# Patient Record
Sex: Female | Born: 1965 | Race: White | Hispanic: No | Smoking: Current every day smoker
Health system: Southern US, Community
[De-identification: ages and names within clinical notes are randomized; demographics above are authoritative.]

## PROBLEM LIST (undated history)

## (undated) DIAGNOSIS — Z720 Tobacco use: Secondary | ICD-10-CM

## (undated) DIAGNOSIS — I509 Heart failure, unspecified: Secondary | ICD-10-CM

## (undated) DIAGNOSIS — I2542 Coronary artery dissection: Secondary | ICD-10-CM

## (undated) DIAGNOSIS — E039 Hypothyroidism, unspecified: Secondary | ICD-10-CM

## (undated) DIAGNOSIS — Z8249 Family history of ischemic heart disease and other diseases of the circulatory system: Secondary | ICD-10-CM

## (undated) DIAGNOSIS — E785 Hyperlipidemia, unspecified: Secondary | ICD-10-CM

## (undated) DIAGNOSIS — I5181 Takotsubo syndrome: Secondary | ICD-10-CM

## (undated) DIAGNOSIS — I2510 Atherosclerotic heart disease of native coronary artery without angina pectoris: Secondary | ICD-10-CM

## (undated) DIAGNOSIS — I5020 Unspecified systolic (congestive) heart failure: Secondary | ICD-10-CM

## (undated) DIAGNOSIS — I5032 Chronic diastolic (congestive) heart failure: Secondary | ICD-10-CM

## (undated) DIAGNOSIS — E079 Disorder of thyroid, unspecified: Secondary | ICD-10-CM

## (undated) DIAGNOSIS — I2119 ST elevation (STEMI) myocardial infarction involving other coronary artery of inferior wall: Secondary | ICD-10-CM

## (undated) HISTORY — DX: Heart failure, unspecified: I50.9

## (undated) HISTORY — DX: Atherosclerotic heart disease of native coronary artery without angina pectoris: I25.10

## (undated) HISTORY — DX: Hyperlipidemia, unspecified: E78.5

## (undated) HISTORY — DX: Chronic diastolic (congestive) heart failure: I50.32

## (undated) HISTORY — PX: CARDIAC CATHETERIZATION: SHX172

## (undated) HISTORY — DX: Coronary artery dissection: I25.42

## (undated) HISTORY — DX: Takotsubo syndrome: I51.81

## (undated) HISTORY — PX: ECTOPIC PREGNANCY SURGERY: SHX613

## (undated) HISTORY — DX: Unspecified systolic (congestive) heart failure: I50.20

## (undated) HISTORY — DX: ST elevation (STEMI) myocardial infarction involving other coronary artery of inferior wall: I21.19

## (undated) HISTORY — DX: Tobacco use: Z72.0

## (undated) HISTORY — DX: Hypothyroidism, unspecified: E03.9

---

## 2020-04-03 LAB — COLOGUARD

## 2020-09-22 ENCOUNTER — Emergency Department: Payer: Medicaid Other

## 2020-09-22 ENCOUNTER — Other Ambulatory Visit: Payer: Self-pay

## 2020-09-22 ENCOUNTER — Emergency Department
Admission: EM | Admit: 2020-09-22 | Discharge: 2020-09-22 | Disposition: A | Discharge status: Home / Self Care | Payer: Medicaid Other | Attending: Emergency Medicine | Admitting: Emergency Medicine

## 2020-09-22 ENCOUNTER — Encounter: Payer: Self-pay | Admitting: Emergency Medicine

## 2020-09-22 DIAGNOSIS — T23202A Burn of second degree of left hand, unspecified site, initial encounter: Secondary | ICD-10-CM | POA: Insufficient documentation

## 2020-09-22 DIAGNOSIS — S20212A Contusion of left front wall of thorax, initial encounter: Secondary | ICD-10-CM | POA: Insufficient documentation

## 2020-09-22 DIAGNOSIS — T23262A Burn of second degree of back of left hand, initial encounter: Secondary | ICD-10-CM

## 2020-09-22 DIAGNOSIS — X12XXXA Contact with other hot fluids, initial encounter: Secondary | ICD-10-CM | POA: Insufficient documentation

## 2020-09-22 DIAGNOSIS — Y929 Unspecified place or not applicable: Secondary | ICD-10-CM | POA: Insufficient documentation

## 2020-09-22 DIAGNOSIS — F17200 Nicotine dependence, unspecified, uncomplicated: Secondary | ICD-10-CM | POA: Insufficient documentation

## 2020-09-22 DIAGNOSIS — W108XXA Fall (on) (from) other stairs and steps, initial encounter: Secondary | ICD-10-CM | POA: Insufficient documentation

## 2020-09-22 DIAGNOSIS — S62337A Displaced fracture of neck of fifth metacarpal bone, left hand, initial encounter for closed fracture: Secondary | ICD-10-CM | POA: Insufficient documentation

## 2020-09-22 HISTORY — DX: Disorder of thyroid, unspecified: E07.9

## 2020-09-22 MED ORDER — CEPHALEXIN 500 MG PO CAPS: 500.0000 mg | ORAL_CAPSULE | Freq: Four times a day (QID) | ORAL | 0 refills | Status: AC

## 2020-09-22 MED ORDER — ACETAMINOPHEN 325 MG PO TABS
650.0000 mg | ORAL_TABLET | Freq: Once | ORAL | Status: AC
  Administered 2020-09-22: 650 mg via ORAL
  Filled 2020-09-22: qty 2

## 2020-09-22 MED ORDER — SULFAMETHOXAZOLE-TRIMETHOPRIM 800-160 MG PO TABS: 1.0000 | ORAL_TABLET | Freq: Two times a day (BID) | ORAL | 0 refills | Status: AC

## 2020-09-22 MED ORDER — OXYCODONE-ACETAMINOPHEN 5-325 MG PO TABS
1.0000 | ORAL_TABLET | Freq: Once | ORAL | Status: AC
  Administered 2020-09-22: 1 via ORAL
  Filled 2020-09-22: qty 1

## 2020-09-22 MED ORDER — SULFAMETHOXAZOLE-TRIMETHOPRIM 800-160 MG PO TABS
1.0000 | ORAL_TABLET | Freq: Once | ORAL | Status: AC
  Administered 2020-09-22: 1 via ORAL
  Filled 2020-09-22: qty 1

## 2020-09-22 MED ORDER — CEPHALEXIN 500 MG PO CAPS
1000.0000 mg | ORAL_CAPSULE | Freq: Once | ORAL | Status: AC
  Administered 2020-09-22: 1000 mg via ORAL
  Filled 2020-09-22: qty 2

## 2020-09-22 MED ORDER — OXYCODONE-ACETAMINOPHEN 5-325 MG PO TABS: 1.0000 | ORAL_TABLET | Freq: Four times a day (QID) | ORAL | 0 refills | Status: AC | PRN
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
## 2020-10-04 ENCOUNTER — Ambulatory Visit: Payer: Medicaid Other | Admitting: Physician Assistant

## 2020-11-02 ENCOUNTER — Encounter: Payer: Medicaid Other | Admitting: Obstetrics and Gynecology

## 2020-12-11 ENCOUNTER — Other Ambulatory Visit: Payer: Self-pay

## 2020-12-11 ENCOUNTER — Encounter
Admission: EM | Disposition: A | Discharge status: Home / Self Care | Payer: Self-pay | Source: Home / Self Care | Attending: Internal Medicine

## 2020-12-11 ENCOUNTER — Inpatient Hospital Stay
Admission: EM | Admit: 2020-12-11 | Discharge: 2020-12-13 | DRG: 280 | Disposition: A | Discharge status: Home / Self Care | Payer: Self-pay | Attending: Internal Medicine | Admitting: Internal Medicine

## 2020-12-11 ENCOUNTER — Emergency Department: Payer: Self-pay

## 2020-12-11 DIAGNOSIS — E785 Hyperlipidemia, unspecified: Secondary | ICD-10-CM

## 2020-12-11 DIAGNOSIS — J9601 Acute respiratory failure with hypoxia: Secondary | ICD-10-CM | POA: Diagnosis present

## 2020-12-11 DIAGNOSIS — I213 ST elevation (STEMI) myocardial infarction of unspecified site: Secondary | ICD-10-CM

## 2020-12-11 DIAGNOSIS — I2542 Coronary artery dissection: Principal | ICD-10-CM

## 2020-12-11 DIAGNOSIS — E039 Hypothyroidism, unspecified: Secondary | ICD-10-CM | POA: Diagnosis present

## 2020-12-11 DIAGNOSIS — I509 Heart failure, unspecified: Secondary | ICD-10-CM

## 2020-12-11 DIAGNOSIS — I5021 Acute systolic (congestive) heart failure: Secondary | ICD-10-CM | POA: Diagnosis present

## 2020-12-11 DIAGNOSIS — Z20822 Contact with and (suspected) exposure to covid-19: Secondary | ICD-10-CM | POA: Diagnosis present

## 2020-12-11 DIAGNOSIS — Z720 Tobacco use: Secondary | ICD-10-CM

## 2020-12-11 DIAGNOSIS — I2510 Atherosclerotic heart disease of native coronary artery without angina pectoris: Secondary | ICD-10-CM | POA: Diagnosis present

## 2020-12-11 DIAGNOSIS — I2121 ST elevation (STEMI) myocardial infarction involving left circumflex coronary artery: Secondary | ICD-10-CM | POA: Diagnosis present

## 2020-12-11 DIAGNOSIS — I2119 ST elevation (STEMI) myocardial infarction involving other coronary artery of inferior wall: Secondary | ICD-10-CM | POA: Diagnosis present

## 2020-12-11 DIAGNOSIS — Z79890 Hormone replacement therapy (postmenopausal): Secondary | ICD-10-CM

## 2020-12-11 DIAGNOSIS — F17200 Nicotine dependence, unspecified, uncomplicated: Secondary | ICD-10-CM

## 2020-12-11 DIAGNOSIS — I110 Hypertensive heart disease with heart failure: Secondary | ICD-10-CM | POA: Diagnosis present

## 2020-12-11 DIAGNOSIS — Z888 Allergy status to other drugs, medicaments and biological substances status: Secondary | ICD-10-CM

## 2020-12-11 DIAGNOSIS — F17210 Nicotine dependence, cigarettes, uncomplicated: Secondary | ICD-10-CM | POA: Diagnosis present

## 2020-12-11 DIAGNOSIS — I420 Dilated cardiomyopathy: Secondary | ICD-10-CM

## 2020-12-11 HISTORY — PX: LEFT HEART CATH AND CORONARY ANGIOGRAPHY: CATH118249

## 2020-12-11 HISTORY — DX: ST elevation (STEMI) myocardial infarction of unspecified site: I21.3

## 2020-12-11 HISTORY — DX: Family history of ischemic heart disease and other diseases of the circulatory system: Z82.49

## 2020-12-11 LAB — CBC
HCT: 37.2 % (ref 36.0–46.0)
Hemoglobin: 12.6 g/dL (ref 12.0–15.0)
MCH: 31.1 pg (ref 26.0–34.0)
MCHC: 33.9 g/dL (ref 30.0–36.0)
MCV: 91.9 fL (ref 80.0–100.0)
Platelets: 321 10*3/uL (ref 150–400)
RBC: 4.05 MIL/uL (ref 3.87–5.11)
RDW: 14.2 % (ref 11.5–15.5)
WBC: 7.6 10*3/uL (ref 4.0–10.5)
nRBC: 0 % (ref 0.0–0.2)

## 2020-12-11 LAB — BASIC METABOLIC PANEL
Anion gap: 10 (ref 5–15)
BUN: 22 mg/dL — ABNORMAL HIGH (ref 6–20)
CO2: 24 mmol/L (ref 22–32)
Calcium: 9.1 mg/dL (ref 8.9–10.3)
Chloride: 104 mmol/L (ref 98–111)
Creatinine, Ser: 0.83 mg/dL (ref 0.44–1.00)
GFR, Estimated: >60 mL/min (ref >60)
Glucose, Bld: 114 mg/dL — ABNORMAL HIGH (ref 70–99)
Potassium: 3.7 mmol/L (ref 3.5–5.1)
Sodium: 138 mmol/L (ref 135–145)

## 2020-12-11 LAB — TROPONIN I (HIGH SENSITIVITY): Troponin I (High Sensitivity): 55 ng/L — ABNORMAL HIGH (ref <18)

## 2020-12-11 SURGERY — LEFT HEART CATH AND CORONARY ANGIOGRAPHY
Anesthesia: Moderate Sedation

## 2020-12-11 MED ORDER — FENTANYL CITRATE PF 50 MCG/ML IJ SOSY
PREFILLED_SYRINGE | INTRAMUSCULAR | Status: AC
  Filled 2020-12-11: qty 1

## 2020-12-11 MED ORDER — MIDAZOLAM HCL 2 MG/2ML IJ SOLN
INTRAMUSCULAR | Status: DC | PRN
  Administered 2020-12-11: 1 mg via INTRAVENOUS

## 2020-12-11 MED ORDER — HEPARIN SODIUM (PORCINE) 5000 UNIT/ML IJ SOLN
4000.0000 [IU] | Freq: Once | INTRAMUSCULAR | Status: AC
  Administered 2020-12-11: 4000 [IU] via INTRAVENOUS

## 2020-12-11 MED ORDER — MIDAZOLAM HCL 2 MG/2ML IJ SOLN
INTRAMUSCULAR | Status: AC
  Filled 2020-12-11: qty 2

## 2020-12-11 MED ORDER — FUROSEMIDE 10 MG/ML IJ SOLN
20.0000 mg | Freq: Once | INTRAMUSCULAR | Status: AC
  Administered 2020-12-11: 20 mg via INTRAVENOUS
  Filled 2020-12-11: qty 4

## 2020-12-11 MED ORDER — LIDOCAINE HCL 1 % IJ SOLN
INTRAMUSCULAR | Status: AC
  Filled 2020-12-11: qty 20

## 2020-12-11 MED ORDER — HEPARIN (PORCINE) IN NACL 1000-0.9 UT/500ML-% IV SOLN
INTRAVENOUS | Status: DC | PRN
  Administered 2020-12-11: 1000 mL

## 2020-12-11 MED ORDER — HEPARIN (PORCINE) IN NACL 1000-0.9 UT/500ML-% IV SOLN
INTRAVENOUS | Status: AC
  Filled 2020-12-11: qty 1000

## 2020-12-11 MED ORDER — HEPARIN SODIUM (PORCINE) 1000 UNIT/ML IJ SOLN
INTRAMUSCULAR | Status: AC
  Filled 2020-12-11: qty 1

## 2020-12-11 MED ORDER — VERAPAMIL HCL 2.5 MG/ML IV SOLN
INTRAVENOUS | Status: AC
  Filled 2020-12-11: qty 2

## 2020-12-11 MED ORDER — MORPHINE SULFATE (PF) 4 MG/ML IV SOLN: 4.0000 mg | Freq: Once | INTRAVENOUS | Status: DC

## 2020-12-11 MED ORDER — IOHEXOL 350 MG/ML SOLN
INTRAVENOUS | Status: DC | PRN
  Administered 2020-12-11: 50 mL

## 2020-12-11 MED ORDER — LIDOCAINE HCL (PF) 1 % IJ SOLN
INTRAMUSCULAR | Status: DC | PRN
  Administered 2020-12-11: 2 mL

## 2020-12-11 MED ORDER — FENTANYL CITRATE (PF) 100 MCG/2ML IJ SOLN
INTRAMUSCULAR | Status: DC | PRN
  Administered 2020-12-11: 50 ug via INTRAVENOUS

## 2020-12-11 MED ORDER — HEPARIN SODIUM (PORCINE) 1000 UNIT/ML IJ SOLN
INTRAMUSCULAR | Status: DC | PRN
  Administered 2020-12-11: 3500 [IU] via INTRAVENOUS

## 2020-12-11 MED ORDER — ASPIRIN 81 MG PO CHEW
324.0000 mg | CHEWABLE_TABLET | Freq: Once | ORAL | Status: AC
  Administered 2020-12-11: 324 mg via ORAL

## 2020-12-11 MED ORDER — VERAPAMIL HCL 2.5 MG/ML IV SOLN
INTRAVENOUS | Status: DC | PRN
  Administered 2020-12-11: 2.5 mg via INTRA_ARTERIAL

## 2020-12-11 SURGICAL SUPPLY — 14 items
CATH INFINITI 5 FR JL3.5 (CATHETERS) ×2 IMPLANT
CATH INFINITI 5FR ANG PIGTAIL (CATHETERS) ×2 IMPLANT
CATH LAUNCHER 6FR JR4 (CATHETERS) ×2 IMPLANT
DEVICE RAD TR BAND REGULAR (VASCULAR PRODUCTS) ×2 IMPLANT
DRAPE BRACHIAL (DRAPES) ×2 IMPLANT
GLIDESHEATH SLEND SS 6F .021 (SHEATH) ×2 IMPLANT
GUIDEWIRE INQWIRE 1.5J.035X260 (WIRE) ×1 IMPLANT
INQWIRE 1.5J .035X260CM (WIRE) ×2
KIT ENCORE 26 ADVANTAGE (KITS) ×2 IMPLANT
PACK CARDIAC CATH (CUSTOM PROCEDURE TRAY) ×2 IMPLANT
PROTECTION STATION PRESSURIZED (MISCELLANEOUS) ×2
SET ATX SIMPLICITY (MISCELLANEOUS) ×2 IMPLANT
STATION PROTECTION PRESSURIZED (MISCELLANEOUS) ×1 IMPLANT
TUBING CIL FLEX 10 FLL-RA (TUBING) ×2 IMPLANT

## 2020-12-11 NOTE — ED Triage Notes (Addendum)
Patient arrived via EMS with c/c of chest pain with radiation to to right arm, nausea, shortness of breath, and diaphoresis. On EKG performed by EMS shows STEMI. Pt was administered 324mg  of ASA and 1 NTG PTA. A&O on arrival

## 2020-12-11 NOTE — ED Notes (Signed)
Cardiologist and radiology at the bedside to perform chest x-ray

## 2020-12-11 NOTE — Progress Notes (Signed)
Prayer for patient as staff worked with patient

## 2020-12-11 NOTE — Interval H&P Note (Signed)
History and Physical Interval Note:  12/11/2020 11:10 PM  Gabrielle Riley  has presented today for surgery, with the diagnosis of STEMI.  The various methods of treatment have been discussed with the patient and family. After consideration of risks, benefits and other options for treatment, the patient has consented to  Procedure(s): Coronary/Graft Acute MI Revascularization (N/A) LEFT HEART CATH AND CORONARY ANGIOGRAPHY (N/A) as a surgical intervention.  The patient's history has been reviewed, patient examined, no change in status, stable for surgery.  I have reviewed the patient's chart and labs.  Questions were answered to the patient's satisfaction.    Cath Lab Visit (complete for each Cath Lab visit)  Clinical Evaluation Leading to the Procedure:   ACS: Yes.    Non-ACS:  N/A  Chantry Headen
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
## 2020-12-11 NOTE — ED Notes (Signed)
ED doctor at the bedside.

## 2020-12-11 NOTE — H&P (View-Only) (Signed)
Cardiology Consultation:   Patient ID: Gabrielle Riley MRN: [id number]; DOB: 02/28/1966  Admit date: 12/11/2020 Date of Consult: 12/11/2020  PCP:  [facility]   [facility] Providers Cardiologist: New - Jamere Stidham   Patient Profile:   Gabrielle Riley is a 55 y.o. female with a hx of thyroid disease and tobacco abuse, who is being seen 12/11/2020 for the evaluation of chest pain and abnormal EKG at the request of Dr. Sidney Ace.  History of Present Illness:   Gabrielle Riley reports that approximately an hour ago, she was feeling generally unwell at home.  She got up to go to bed and had sudden onset of a severe burning and pressure-like sensation in the center of her chest.  It radiates to the left armpit and is associated with shortness of breath and transient nausea.  She is not felt anything like this before.  She reports having injured her left hand several months ago and began to feel some aching in the left hand earlier today for which she took one of her remaining Percocet.  Aforementioned chest pain began about an hour later.  She currently complains of continued severe chest pain as well as shortness of breath.  She was noted to be hypoxic in the emergency department and is currently satting in the low to mid 90s on 3 L of nasal cannula.   Past Medical History:  Diagnosis Date   Thyroid disease     Past Surgical History:  Procedure Laterality Date   ECTOPIC PREGNANCY SURGERY       Home Medications:  Prior to Admission medications   Medication Sig Start Date Dnaiel Riley Date Taking? Authorizing Provider  levothyroxine (SYNTHROID) 100 MCG tablet Take 100 mcg by mouth daily before breakfast.   Yes [provider]  oxyCODONE-acetaminophen (PERCOCET) 10-325 MG tablet Take 1 tablet by mouth every 4 (four) hours as needed for pain.   Yes [provider]    Inpatient Medications: Scheduled Meds:  aspirin  324 mg Oral Once   furosemide  20 mg Intravenous Once    heparin  4,000 Units Intravenous Once   Continuous Infusions:  PRN Meds:   Allergies:    Allergies  Allergen Reactions   Imitrex [Sumatriptan]     Throat closing    Social History:   Social History   Tobacco Use   Smoking status: Every Day    Packs/day: 0.50    Years: 25.00    Pack years: 12.50    Types: Cigarettes  Substance Use Topics   Alcohol use: Not Currently   Drug use: Not Currently     Family History:   Brother recently had stent placed in a coronary artery (late 24s).  ROS:  Unable to assess due to acute illness.  Physical Exam/Data:   Vitals:   12/11/20 2235 12/11/20 2242 12/11/20 2245  BP:  (!) 162/102   Pulse:  81   Resp:  (!) 22   Temp:  (!) 97.5 F (36.4 C)   TempSrc:  Oral   SpO2: 100% 92%   Weight:   68.9 kg  Height:   5\' 1"  (1.549 m)   No intake or output data in the 24 hours ending 12/11/20 2256 Last 3 Weights 12/11/2020  Weight (lbs) 151 lb 14.4 oz  Weight (kg) 68.901 kg     Body mass index is 28.7 kg/m.  General: Uncomfortable appearing woman, lying on stretcher in the emergency department. HEENT: normal Lymph: no adenopathy Neck: Unable to assess JVP  due to accessory muscle use Endocrine:  No thryomegaly Vascular: No carotid bruits; 2+ radial pulses bilaterally. Cardiac:  normal S1, S2; RRR; no murmurs, rubs, or gallops. Lungs: Coarse breath sounds bilaterally with bibasilar crackles. Abd: soft, nontender, no hepatomegaly  Ext: no edema Musculoskeletal:  No deformities, BUE and BLE strength normal and equal Skin: warm and dry  Neuro:  CNs 2-12 intact, no focal abnormalities noted Psych:  Normal affect   EKG:  The EKG was personally reviewed and demonstrates: Normal sinus rhythm with 1 to 2 mm inferolateral ST elevation. Telemetry:  Telemetry was personally reviewed and demonstrates: Sinus rhythm.  Relevant CV Studies: None.  Laboratory Data:  High Sensitivity Troponin:  No results for input(s): TROPONINIHS in the last  720 hours.   ChemistryNo results for input(s): NA, K, CL, CO2, GLUCOSE, BUN, CREATININE, CALCIUM, GFRNONAA, GFRAA, ANIONGAP in the last 168 hours.  No results for input(s): PROT, ALBUMIN, AST, ALT, ALKPHOS, BILITOT in the last 168 hours. Hematology Recent Labs  Lab 12/11/20 2234  WBC 7.6  RBC 4.05  HGB 12.6  HCT 37.2  MCV 91.9  MCH 31.1  MCHC 33.9  RDW 14.2  PLT 321   BNPNo results for input(s): BNP, PROBNP in the last 168 hours.  DDimer No results for input(s): DDIMER in the last 168 hours.   Radiology/Studies:  No results found.   Assessment and Plan:   Inferolateral STEMI: Patient presents with acute onset of chest pain this evening, found to have inferolateral ST elevation concerning for STEMI.  She continues to have severe pain as well as shortness of breath accompanied by hypoxia.  We have discussed further management options and agreed to proceed with emergent cardiac catheterization and possible PCI.  She has provided emergent verbal consent.  She has received aspirin and heparin.  Further recommendations to be made following catheterization.  Acute respiratory failure with hypoxia and acute heart failure: I am concerned that acute dyspnea and hypoxia are due to heart failure in the setting of acute MI.  She does not appear significant volume load on exam though crackles are appreciated prolonged.  Preliminary review of her chest radiograph also shows evidence of interstitial edema.  I have asked the ED team to administer furosemide 20 mg IV x1.  Echocardiogram to be performed after catheterization.  We will also assess her LVEDP during the procedure.  Tobacco abuse: Smoking cessation recommended.  Thyroid disease: Continue home dose of Synthroid.   Risk Assessment/Risk Scores:   TIMI Risk Score for ST  Elevation MI:   The patient's TIMI risk score is 2, which indicates a 2.2% risk of all cause mortality at 30 days  New York Heart Association (NYHA) Functional  Class NYHA Class IV  For questions or updates, please contact [facility] Please consult [email or web address] for contact info under [facility].  Signed, Yvonne Kendall, MD  12/11/2020 10:56 PM

## 2020-12-11 NOTE — ED Notes (Signed)
Patient transported to the cath lab via stretcher on zoll monitor by RN x 2 and cardiology. Guarded at transfer

## 2020-12-12 ENCOUNTER — Inpatient Hospital Stay (HOSPITAL_COMMUNITY)
Admit: 2020-12-12 | Discharge: 2020-12-12 | Disposition: A | Discharge status: Home / Self Care | Payer: Self-pay | Attending: Internal Medicine | Admitting: Internal Medicine

## 2020-12-12 ENCOUNTER — Encounter: Payer: Self-pay | Admitting: Internal Medicine

## 2020-12-12 DIAGNOSIS — I509 Heart failure, unspecified: Secondary | ICD-10-CM | POA: Insufficient documentation

## 2020-12-12 DIAGNOSIS — I2121 ST elevation (STEMI) myocardial infarction involving left circumflex coronary artery: Secondary | ICD-10-CM

## 2020-12-12 LAB — CBC
HCT: 40.1 % (ref 36.0–46.0)
Hemoglobin: 13.3 g/dL (ref 12.0–15.0)
MCH: 29.6 pg (ref 26.0–34.0)
MCHC: 33.2 g/dL (ref 30.0–36.0)
MCV: 89.1 fL (ref 80.0–100.0)
Platelets: 329 10*3/uL (ref 150–400)
RBC: 4.5 MIL/uL (ref 3.87–5.11)
RDW: 14.3 % (ref 11.5–15.5)
WBC: 9.8 10*3/uL (ref 4.0–10.5)
nRBC: 0 % (ref 0.0–0.2)

## 2020-12-12 LAB — URINE DRUG SCREEN, QUALITATIVE (ARMC ONLY)
Amphetamines, Ur Screen: NOT DETECTED
Barbiturates, Ur Screen: NOT DETECTED
Benzodiazepine, Ur Scrn: POSITIVE — AB
Cannabinoid 50 Ng, Ur ~~LOC~~: NOT DETECTED
Cocaine Metabolite,Ur ~~LOC~~: NOT DETECTED
MDMA (Ecstasy)Ur Screen: NOT DETECTED
Methadone Scn, Ur: NOT DETECTED
Opiate, Ur Screen: NOT DETECTED
Phencyclidine (PCP) Ur S: NOT DETECTED
Tricyclic, Ur Screen: NOT DETECTED

## 2020-12-12 LAB — ECHOCARDIOGRAM COMPLETE
AR max vel: 1.91 cm2
AV Area VTI: 2.06 cm2
AV Area mean vel: 2.04 cm2
AV Mean grad: 3 mmHg
AV Peak grad: 5.4 mmHg
Ao pk vel: 1.16 m/s
Area-P 1/2: 5.2 cm2
Height: 61 in
MV VTI: 2.51 cm2
S' Lateral: 2.74 cm
Weight: 2430.4 oz

## 2020-12-12 LAB — TROPONIN I (HIGH SENSITIVITY)
Troponin I (High Sensitivity): 6142 ng/L (ref <18)
Troponin I (High Sensitivity): 18237 ng/L (ref <18)
Troponin I (High Sensitivity): 12988 ng/L (ref <18)

## 2020-12-12 LAB — MRSA NEXT GEN BY PCR, NASAL: MRSA by PCR Next Gen: NOT DETECTED

## 2020-12-12 LAB — BASIC METABOLIC PANEL
Anion gap: 10 (ref 5–15)
BUN: 20 mg/dL (ref 6–20)
CO2: 25 mmol/L (ref 22–32)
Calcium: 8.9 mg/dL (ref 8.9–10.3)
Chloride: 103 mmol/L (ref 98–111)
Creatinine, Ser: 0.69 mg/dL (ref 0.44–1.00)
GFR, Estimated: >60 mL/min (ref >60)
Glucose, Bld: 134 mg/dL — ABNORMAL HIGH (ref 70–99)
Potassium: 3.6 mmol/L (ref 3.5–5.1)
Sodium: 138 mmol/L (ref 135–145)

## 2020-12-12 LAB — RESP PANEL BY RT-PCR (FLU A&B, COVID) ARPGX2
Influenza A by PCR: NEGATIVE
Influenza B by PCR: NEGATIVE
SARS Coronavirus 2 by RT PCR: NEGATIVE

## 2020-12-12 LAB — GLUCOSE, CAPILLARY: Glucose-Capillary: 96 mg/dL (ref 70–99)

## 2020-12-12 MED ORDER — CARVEDILOL 3.125 MG PO TABS
3.1250 mg | ORAL_TABLET | Freq: Two times a day (BID) | ORAL | Status: DC
  Administered 2020-12-12 – 2020-12-13 (×4): 3.125 mg via ORAL
  Filled 2020-12-12 (×5): qty 1

## 2020-12-12 MED ORDER — ASPIRIN-ACETAMINOPHEN-CAFFEINE 250-250-65 MG PO TABS
1.0000 | ORAL_TABLET | Freq: Once | ORAL | Status: AC
  Administered 2020-12-12: 1 via ORAL
  Filled 2020-12-12: qty 1

## 2020-12-12 MED ORDER — LABETALOL HCL 5 MG/ML IV SOLN: 10.0000 mg | INTRAVENOUS | Status: AC | PRN

## 2020-12-12 MED ORDER — POTASSIUM CHLORIDE CRYS ER 20 MEQ PO TBCR
40.0000 meq | EXTENDED_RELEASE_TABLET | Freq: Once | ORAL | Status: AC
  Administered 2020-12-12: 40 meq via ORAL
  Filled 2020-12-12: qty 2

## 2020-12-12 MED ORDER — PERFLUTREN LIPID MICROSPHERE
1.0000 mL | INTRAVENOUS | Status: AC | PRN
  Administered 2020-12-12: 3 mL via INTRAVENOUS
  Filled 2020-12-12: qty 10

## 2020-12-12 MED ORDER — LOSARTAN POTASSIUM 25 MG PO TABS
12.5000 mg | ORAL_TABLET | Freq: Every day | ORAL | Status: DC
  Administered 2020-12-12 – 2020-12-13 (×2): 12.5 mg via ORAL
  Filled 2020-12-12 (×2): qty 0.5

## 2020-12-12 MED ORDER — SODIUM CHLORIDE 0.9% FLUSH
3.0000 mL | Freq: Two times a day (BID) | INTRAVENOUS | Status: DC
  Administered 2020-12-12 – 2020-12-13 (×3): 3 mL via INTRAVENOUS

## 2020-12-12 MED ORDER — SODIUM CHLORIDE 0.9 % IV SOLN: 250.0000 mL | INTRAVENOUS | Status: DC | PRN

## 2020-12-12 MED ORDER — MORPHINE SULFATE (PF) 2 MG/ML IV SOLN
2.0000 mg | INTRAVENOUS | Status: AC | PRN
  Administered 2020-12-12 (×3): 2 mg via INTRAVENOUS
  Filled 2020-12-12 (×3): qty 1

## 2020-12-12 MED ORDER — ATORVASTATIN CALCIUM 20 MG PO TABS
20.0000 mg | ORAL_TABLET | Freq: Every day | ORAL | Status: DC
  Administered 2020-12-12 – 2020-12-13 (×2): 20 mg via ORAL
  Filled 2020-12-12 (×2): qty 1

## 2020-12-12 MED ORDER — ACETAMINOPHEN 325 MG PO TABS
650.0000 mg | ORAL_TABLET | ORAL | Status: DC | PRN
Start: 2020-12-12 — End: 2020-12-14
  Administered 2020-12-12 – 2020-12-13 (×3): 650 mg via ORAL
  Filled 2020-12-12 (×3): qty 2

## 2020-12-12 MED ORDER — NITROGLYCERIN IN D5W 200-5 MCG/ML-% IV SOLN
0.0000 ug/min | INTRAVENOUS | Status: DC
  Administered 2020-12-12: 5 ug/min via INTRAVENOUS
  Filled 2020-12-12: qty 250

## 2020-12-12 MED ORDER — CHLORHEXIDINE GLUCONATE CLOTH 2 % EX PADS
6.0000 | MEDICATED_PAD | Freq: Every day | CUTANEOUS | Status: DC
  Administered 2020-12-12 – 2020-12-13 (×2): 6 via TOPICAL

## 2020-12-12 MED ORDER — OXYCODONE HCL 5 MG PO TABS
5.0000 mg | ORAL_TABLET | ORAL | Status: DC | PRN
  Administered 2020-12-12 (×2): 5 mg via ORAL
  Filled 2020-12-12 (×3): qty 1

## 2020-12-12 MED ORDER — HYDRALAZINE HCL 20 MG/ML IJ SOLN: 10.0000 mg | INTRAMUSCULAR | Status: AC | PRN

## 2020-12-12 MED ORDER — ASPIRIN 81 MG PO CHEW
81.0000 mg | CHEWABLE_TABLET | Freq: Every day | ORAL | Status: DC
  Administered 2020-12-12 – 2020-12-13 (×2): 81 mg via ORAL
  Filled 2020-12-12 (×2): qty 1

## 2020-12-12 MED ORDER — FUROSEMIDE 10 MG/ML IJ SOLN
20.0000 mg | Freq: Two times a day (BID) | INTRAMUSCULAR | Status: DC
  Administered 2020-12-12 – 2020-12-13 (×3): 20 mg via INTRAVENOUS
  Filled 2020-12-12 (×3): qty 2

## 2020-12-12 MED ORDER — SODIUM CHLORIDE 0.9% FLUSH: 3.0000 mL | INTRAVENOUS | Status: DC | PRN

## 2020-12-12 MED ORDER — ISOSORBIDE MONONITRATE ER 30 MG PO TB24
15.0000 mg | ORAL_TABLET | Freq: Every day | ORAL | Status: DC
  Administered 2020-12-12 – 2020-12-13 (×2): 15 mg via ORAL
  Filled 2020-12-12 (×2): qty 1

## 2020-12-12 MED ORDER — LEVOTHYROXINE SODIUM 100 MCG PO TABS
100.0000 ug | ORAL_TABLET | Freq: Every day | ORAL | Status: DC
  Administered 2020-12-12 – 2020-12-13 (×2): 100 ug via ORAL
  Filled 2020-12-12 (×2): qty 1

## 2020-12-12 MED ORDER — ONDANSETRON HCL 4 MG/2ML IJ SOLN
4.0000 mg | Freq: Four times a day (QID) | INTRAMUSCULAR | Status: DC | PRN
  Administered 2020-12-12: 4 mg via INTRAVENOUS
  Filled 2020-12-12: qty 2

## 2020-12-12 NOTE — Plan of Care (Signed)
Patient received from cath lab in severe pain 7 out of 10. Burning, aching pain. Dr.End at bedside, aware of patients chest pain, stated it is to be expected and will possible last for a day or so.

## 2020-12-12 NOTE — Progress Notes (Signed)
Dr.Duncan aware of critical troponin.

## 2020-12-12 NOTE — Progress Notes (Signed)
Progress Note  Patient Name: Gabrielle Riley Date of Encounter: 12/12/2020  [facility] Cardiologist: Dr. Okey Dupre  Subjective   Sleeping comfortably in bed, denies chest pain.  Inpatient Medications    Scheduled Meds:  aspirin  81 mg Oral Daily   atorvastatin  20 mg Oral Daily   carvedilol  3.125 mg Oral BID WC   Chlorhexidine Gluconate Cloth  6 each Topical Q0600   furosemide  20 mg Intravenous BID   isosorbide mononitrate  15 mg Oral Daily   levothyroxine  100 mcg Oral Q0600   losartan  12.5 mg Oral Daily   sodium chloride flush  3 mL Intravenous Q12H   Continuous Infusions:  sodium chloride     nitroGLYCERIN Stopped (12/12/20 1122)   PRN Meds: sodium chloride, acetaminophen, morphine injection, ondansetron (ZOFRAN) IV, oxyCODONE, sodium chloride flush   Vital Signs    Vitals:   12/12/20 1400 12/12/20 1500 12/12/20 1600 12/12/20 1700  BP: (!) 102/56 137/83 132/90 128/86  Pulse:      Resp: 20 14 20 20   Temp:   98.1 F (36.7 C)   TempSrc:   Oral   SpO2: 96% 98%    Weight:      Height:        Intake/Output Summary (Last 24 hours) at 12/12/2020 1721 Last data filed at 12/12/2020 1600 Gross per 24 hour  Intake 565.77 ml  Output 3550 ml  Net -2984.23 ml   Last 3 Weights 12/11/2020  Weight (lbs) 151 lb 14.4 oz  Weight (kg) 68.901 kg      Telemetry    Sinus rhythm- Personally Reviewed  ECG     - Personally Reviewed  Physical Exam   GEN: No acute distress.   Neck: No JVD Cardiac: RRR, no murmurs, rubs, or gallops.  Respiratory: Clear to auscultation bilaterally. GI: Soft, nontender, non-distended  MS: No edema; No deformity. Neuro:  Nonfocal  Psych: Normal affect   Labs    High Sensitivity Troponin:   Recent Labs  Lab 12/11/20 2234 12/12/20 0523 12/12/20 1113  TROPONINIHS 55* 6,142* 18,237*      Chemistry Recent Labs  Lab 12/11/20 2234 12/12/20 0523  NA 138 138  K 3.7 3.6  CL 104 103  CO2 24 25  GLUCOSE 114* 134*  BUN 22* 20   CREATININE 0.83 0.69  CALCIUM 9.1 8.9  GFRNONAA >60 >60  ANIONGAP 10 10     Hematology Recent Labs  Lab 12/11/20 2234 12/12/20 0523  WBC 7.6 9.8  RBC 4.05 4.50  HGB 12.6 13.3  HCT 37.2 40.1  MCV 91.9 89.1  MCH 31.1 29.6  MCHC 33.9 33.2  RDW 14.2 14.3  PLT 321 329    BNPNo results for input(s): BNP, PROBNP in the last 168 hours.   DDimer No results for input(s): DDIMER in the last 168 hours.   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  Vessel is too small/distal for intervention. Mild, nonobstructive coronary artery disease involving the LAD and nondominant RCA. Moderately reduced left ventricular systolic function with apical akinesis and otherwise preserved left ventricular ejection fraction.  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 Moderately-severely elevated left ventricular filling pressure consistent with acute heart failure. Recommendations: Admit to ICU. Single antiplatelet therapy with aspirin 81 mg daily.  Avoid P2Y12 inhibitors and heparin in the setting of spontaneous coronary  artery dissection. Obtain echocardiogram. Continue diuresis with furosemide 20 mg IV twice daily (patient responded well to this in the cath lab). Start carvedilol 3.125 mg twice daily and losartan 12.5 mg daily. Initiate nitroglycerin infusion for relief of chest pain.  Oxycodone and/or morphine can be utilized in the short-term for breakthrough pain. Medical therapy and risk factor modification to prevent progression of mild atherosclerotic coronary artery disease also noted. 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  Vessel is too small/distal  for intervention. Mild, nonobstructive coronary artery disease involving the LAD and nondominant RCA. Moderately reduced left ventricular systolic function with apical akinesis and otherwise preserved left ventricular ejection fraction.  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 Moderately-severely elevated left ventricular filling pressure consistent with acute heart failure. Recommendations: Admit to ICU. Single antiplatelet therapy with aspirin 81 mg daily.  Avoid P2Y12 inhibitors and heparin in the setting of spontaneous coronary artery dissection. Obtain echocardiogram. Continue diuresis with furosemide 20 mg IV twice daily (patient responded well to this in the cath lab). Start carvedilol 3.125 mg twice daily and losartan 12.5 mg daily. Initiate nitroglycerin infusion for relief of chest pain.  Oxycodone and/or morphine can be utilized in the short-term for breakthrough pain. Medical therapy and risk factor modification to prevent progression of mild atherosclerotic coronary artery disease also noted. Yvonne Kendallhristopher End, MD [facility]  DG Chest Portable 1 View  Result Date: 12/11/2020 CLINICAL DATA:  Chest pain radiating to the right arm. Nausea, shortness of breath, diaphoresis. EXAM: PORTABLE CHEST 1 VIEW COMPARISON:  09/22/2020 FINDINGS: Shallow inspiration. Linear infiltration or atelectasis in the lung bases. No pleural effusions. No pneumothorax. Mediastinal contours appear intact. Heart size is normal. Multiple extrinsic wires and patches projected over the chest limits evaluation. IMPRESSION: Shallow inspiration with infiltration or atelectasis in the lung bases. Electronically Signed   By: Burman NievesWilliam  Stevens M.D.   On: 12/11/2020 22:56   ECHOCARDIOGRAM COMPLETE  Result Date: 12/12/2020    ECHOCARDIOGRAM REPORT   Patient Name:   Gabrielle Riley Date of Exam: 12/12/2020 Medical Rec #:  [id number]     Height:       61.0 in Accession #:    [id number]     Weight:       151.9 lb Date of Birth:  05/10/1965     BSA:          1.680 m Patient Age:    54 years      BP:           107/79 mmHg Patient Gender: F             HR:           65 bpm. Exam Location:  [facility] Procedure: 2D Echo, Color Doppler, Cardiac Doppler and Intracardiac            Opacification Agent Indications:     I21.9 Acute myocardial infarction, unspecified  History:         Patient has no prior history of Echocardiogram examinations.                  Signs/Symptoms:Chest Pain.  Sonographer:     Humphrey RollsJoan Heiss Referring Phys:  [phone] CHRISTOPHER END Diagnosing Phys: Debbe OdeaBrian Agbor-Etang MD  Sonographer Comments: Technically difficult study due to poor echo windows. Image acquisition challenging due to breast implants. IMPRESSIONS  1. Hypercontractile LV basal  segments with apical hypokinesis suggesting stress induced (Takotsubo) cardiomyopathy or mid-distal LAD disease. . Left ventricular ejection fraction, by estimation, is 45 to 50%. The left ventricle has mildly decreased function. The left ventricle demonstrates regional wall motion abnormalities (see scoring diagram/findings for description). Left ventricular diastolic parameters are consistent with Grade I diastolic dysfunction (impaired relaxation). There is akinesis of the left ventricular, entire apical segment.  2. Right ventricular systolic function is normal. The right ventricular size is normal.  3. The mitral valve is normal in structure. No evidence of mitral valve regurgitation.  4. The aortic valve was not well visualized. Aortic valve regurgitation is not visualized.  5. The inferior vena cava is normal in size with greater than 50% respiratory variability, suggesting right atrial pressure of 3 mmHg. FINDINGS  Left Ventricle: Hypercontractile LV basal segments with apical hypokinesis suggesting stress induced (Takotsubo) cardiomyopathy or mid-distal LAD disease. Left ventricular ejection fraction, by estimation, is 45 to 50%. The left ventricle has  mildly decreased function. The left ventricle demonstrates regional wall motion abnormalities. Definity contrast agent was given IV to delineate the left ventricular endocardial borders. The left ventricular internal cavity size was normal in size. There is no left ventricular hypertrophy. Left ventricular diastolic parameters are consistent with Grade I diastolic dysfunction (impaired relaxation). Right Ventricle: The right ventricular size is normal. No increase in right ventricular wall thickness. Right ventricular systolic function is normal. Left Atrium: Left atrial size was normal in size. Right Atrium: Right atrial size was normal in size. Pericardium: There is no evidence of pericardial effusion. Mitral Valve: The mitral valve is normal in structure. No evidence of mitral valve regurgitation. MV peak gradient, 3.1 mmHg. The mean mitral valve gradient is 1.0 mmHg. Tricuspid Valve: The tricuspid valve is not well visualized. Tricuspid valve regurgitation is not demonstrated. Aortic Valve: The aortic valve was not well visualized. Aortic valve regurgitation is not visualized. Aortic valve mean gradient measures 3.0 mmHg. Aortic valve peak gradient measures 5.4 mmHg. Aortic valve area, by VTI measures 2.06 cm. Pulmonic Valve: The pulmonic valve was not well visualized. Pulmonic valve regurgitation is not visualized. Aorta: The aortic root is normal in size and structure. Venous: The inferior vena cava is normal in size with greater than 50% respiratory variability, suggesting right atrial pressure of 3 mmHg. IAS/Shunts: No atrial level shunt detected by color flow Doppler.  LEFT VENTRICLE PLAX 2D LVIDd:         4.85 cm  Diastology LVIDs:         2.74 cm  LV e' medial:    3.26 cm/s LV PW:         1.03 cm  LV E/e' medial:  15.2 LV IVS:        0.72 cm  LV e' lateral:   4.79 cm/s LVOT diam:     1.90 cm  LV E/e' lateral: 10.4 LV SV:         45 LV SV Index:   27 LVOT Area:     2.84 cm  LEFT ATRIUM             Index  LA diam:        3.20 cm 1.90 cm/m LA Vol (A2C):   19.1 ml 11.37 ml/m LA Vol (A4C):   31.0 ml 18.45 ml/m LA Biplane Vol: 24.9 ml 14.82 ml/m  AORTIC VALVE                   PULMONIC VALVE AV Area (Vmax):  1.91 cm    PV Vmax:       0.65 m/s AV Area (Vmean):   2.04 cm    PV Vmean:      41.200 cm/s AV Area (VTI):     2.06 cm    PV VTI:        0.091 m AV Vmax:           116.00 cm/s PV Peak grad:  1.7 mmHg AV Vmean:          73.400 cm/s PV Mean grad:  1.0 mmHg AV VTI:            0.220 m AV Peak Grad:      5.4 mmHg AV Mean Grad:      3.0 mmHg LVOT Vmax:         78.30 cm/s LVOT Vmean:        52.900 cm/s LVOT VTI:          0.160 m LVOT/AV VTI ratio: 0.73  AORTA Ao Root diam: 3.00 cm MITRAL VALVE MV Area (PHT): 5.20 cm    SHUNTS MV Area VTI:   2.51 cm    Systemic VTI:  0.16 m MV Peak grad:  3.1 mmHg    Systemic Diam: 1.90 cm MV Mean grad:  1.0 mmHg MV Vmax:       0.88 m/s MV Vmean:      50.4 cm/s MV Decel Time: 146 msec MV E velocity: 49.70 cm/s MV A velocity: 81.80 cm/s MV E/A ratio:  0.61 Debbe Odea MD Electronically signed by Debbe Odea MD Signature Date/Time: 12/12/2020/2:40:51 PM    Final     Cardiac Studies   TTE 12/12/2020 1. Hypercontractile LV basal segments with apical hypokinesis suggesting  stress induced (Takotsubo) cardiomyopathy or mid-distal LAD disease. .  Left ventricular ejection fraction, by estimation, is 45 to 50%. The left  ventricle has mildly decreased  function. The left ventricle demonstrates regional wall motion  abnormalities (see scoring diagram/findings for description). Left  ventricular diastolic parameters are consistent with Grade I diastolic  dysfunction (impaired relaxation). There is akinesis  of the left ventricular, entire apical segment.   2. Right ventricular systolic function is normal. The right ventricular  size is normal.   3. The mitral valve is normal in structure. No evidence of mitral valve  regurgitation.   4. The aortic valve was not  well visualized. Aortic valve regurgitation  is not visualized.   5. The inferior vena cava is normal in size with greater than 50%  respiratory variability, suggesting right atrial pressure of 3 mmHg.   Patient Profile     55 y.o. 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  Assessment & Plan    CAD, SCAD in OM2 -Nonobstructive mild disease in LAD and nondominant RCA -Medical management with aspirin, Lipitor. -Start Imdur 15 mg daily.  Titrate as needed for chest pain.   -Titrate of nitroglycerin drip due to headaches.  2.  Echo with mildly reduced ejection fraction, EF 45 to 50%  -akinesis of the apex, hypercontractile base suggesting Takotsubo. -Continue Coreg, start losartan. -Repeat echocardiogram as outpatient to evaluate wall motion abnormality. -Ambulate, transfer to telemetry.   3.  Smoking -Cessation recommended  Total encounter time 35 minutes  Greater than 50% was spent in counseling and coordination of care with the patient     Signed, Debbe Odea, MD  12/12/2020, 5:21 PM

## 2020-12-12 NOTE — Progress Notes (Signed)
PROGRESS NOTE    Gabrielle Riley  AST:[id number] DOB: 11/22/1965 DOA: 12/11/2020 PCP: [facility]   Brief Narrative:  Gabrielle Riley is a 55 y.o. female with medical history significant for Hypothyroidism who is being admitted to the hospitalist service status post cath after presenting as a code STEMI.  Patient developed acute chest pain while at rest at home radiating to the right axilla and back.  Was also noted to have pulmonary edema.  Was evaluated by cardiologist, Dr. 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  Was also noted to have left ventricular dysfunction.  Suspicion for Takotsubo and acute heart failure, per verbal report from Dr. Okey Dupre.  She was started on a nitroglycerin infusion for persistent chest discomfort and transferred from the Cath Lab to stepdown unit in stable condition.  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  Vessel is too small/distal for intervention - Recommendations per Dr. Okey Dupre : "Single antiplatelet therapy with ASA 81 daily, avoiding P2 Y 12 inhibitors and heparin in the setting of spontaneous coronary artery dissection" - Nitroglycerin infusion discontinued given headache and resolution of chest pain and hypertension, continue oxycodone/morphine for pain relief but no chest pain now for more than  12h   Acute CHF (congestive heart failure) (HCC), ongoing Acute hypoxic respiratory failure, improving Stress-induced cardiomyopathy - LVEF 35 to 45% on cath with findings consistent with stress-induced cardiomyopathy/Takotsubo - Lasix 20 mg IV twice daily per Dr. Okey Dupre - Carvedilol 3.125 mg twice daily and losartan 12.5 mg daily     DVT prophylaxis: SCDs given dissection Code Status: full code  Family Communication:  none   Status is: Inpatient  Dispo: The patient is from: Home              Anticipated d/c is to: To be determined              Anticipated d/c date is: 48 to 72 hours              Patient currently not medically stable for discharge  Consultants:  Cardiology  Procedures:  Cardiac catheterization  Antimicrobials:  None  Subjective: No acute issues or events overnight, patient complaining of profound headache, denies chest pain nausea vomiting diarrhea constipation headache fevers or chills  Objective: Vitals:   12/12/20 0645 12/12/20 0700 12/12/20 0715 12/12/20 0730  BP: 115/81 122/74 121/82 115/80  Pulse:    66  Resp: 17 17 18 19   Temp:    98 F (36.7 C)  TempSrc:    Axillary  SpO2:    90%  Weight:      Height:        Intake/Output Summary (Last 24 hours) at 12/12/2020 0814 Last data filed at 12/12/2020 0730 Gross per 24 hour  Intake 57.78 ml  Output 2850 ml  Net -2792.22 ml   Filed Weights   12/11/20 2245  Weight:  68.9 kg    Examination:  General exam: Appears calm and comfortable  Respiratory system: Clear to auscultation. Respiratory effort normal. Cardiovascular system: S1 & S2 heard, RRR. No JVD, murmurs, rubs, gallops or clicks. No pedal edema. Gastrointestinal system: Abdomen is nondistended, soft and nontender. No organomegaly or masses felt. Normal bowel sounds heard. Central nervous system: Alert and oriented. No focal neurological deficits. Extremities: Symmetric 5 x 5 power. Skin: No rashes, lesions or ulcers Psychiatry:  Judgement and insight appear normal. Mood & affect appropriate.     Data Reviewed: I have personally reviewed following labs and imaging studies  CBC: Recent Labs  Lab 12/11/20 2234 12/12/20 0523  WBC 7.6 9.8  HGB 12.6 13.3  HCT 37.2 40.1  MCV 91.9 89.1  PLT 321 329   Basic Metabolic Panel: Recent Labs  Lab 12/11/20 2234 12/12/20 0523  NA 138 138  K 3.7 3.6  CL 104 103  CO2 24 25  GLUCOSE 114* 134*  BUN 22* 20  CREATININE 0.83 0.69  CALCIUM 9.1 8.9   GFR: Estimated Creatinine Clearance: 69.7 mL/min (by C-G formula based on SCr of 0.69 mg/dL). Liver Function Tests: No results for input(s): AST, ALT, ALKPHOS, BILITOT, PROT, ALBUMIN in the last 168 hours. No results for input(s): LIPASE, AMYLASE in the last 168 hours. No results for input(s): AMMONIA in the last 168 hours. Coagulation Profile: No results for input(s): INR, PROTIME in the last 168 hours. Cardiac Enzymes: No results for input(s): CKTOTAL, CKMB, CKMBINDEX, TROPONINI in the last 168 hours. BNP (last 3 results) No results for input(s): PROBNP in the last 8760 hours. HbA1C: No results for input(s): HGBA1C in the last 72 hours. CBG: No results for input(s): GLUCAP in the last 168 hours. Lipid Profile: No results for input(s): CHOL, HDL, LDLCALC, TRIG, CHOLHDL, LDLDIRECT in the last 72 hours. Thyroid Function Tests: No results for input(s): TSH, T4TOTAL, FREET4, T3FREE, THYROIDAB in the last 72 hours. Anemia Panel: No results for input(s): VITAMINB12, FOLATE, FERRITIN, TIBC, IRON, RETICCTPCT in the last 72 hours. Sepsis Labs: No results for input(s): PROCALCITON, LATICACIDVEN in the last 168 hours.  Recent Results (from the past 240 hour(s))  Resp Panel by RT-PCR (Flu A&B, Covid) Nasopharyngeal Swab     Status: None   Collection Time: 12/11/20 10:34 PM   Specimen: Nasopharyngeal Swab; Nasopharyngeal(NP) swabs in vial transport medium  Result Value Ref Range Status   SARS Coronavirus 2 by RT PCR  NEGATIVE NEGATIVE Final    Comment: (NOTE) SARS-CoV-2 target nucleic acids are NOT DETECTED.  The SARS-CoV-2 RNA is generally detectable in upper respiratory specimens during the acute phase of infection. The lowest concentration of SARS-CoV-2 viral copies this assay can detect is 138 copies/mL. A negative result does not preclude SARS-Cov-2 infection and should not be used as the sole basis for treatment or other patient management decisions. A negative result may occur with  improper specimen collection/handling, submission of specimen other than nasopharyngeal swab, presence of viral mutation(s) within the areas targeted by this assay, and inadequate number of viral copies(<138 copies/mL). A negative result must be combined with clinical observations, patient history, and epidemiological information. The expected result is Negative.  Fact Sheet for Patients:  BloggerCourse.com  Fact Sheet for Healthcare Providers:  SeriousBroker.it  This test is no t yet approved or cleared by the [place] FDA and  has been authorized for detection and/or diagnosis of SARS-CoV-2 by FDA under an Emergency Use Authorization (EUA). This EUA will remain  in effect (  meaning this test can be used) for the duration of the COVID-19 declaration under Section 564(b)(1) of the Act, 21 U.S.C.section 360bbb-3(b)(1), unless the authorization is terminated  or revoked sooner.       Influenza A by PCR NEGATIVE NEGATIVE Final   Influenza B by PCR NEGATIVE NEGATIVE Final    Comment: (NOTE) The Xpert Xpress SARS-CoV-2/FLU/RSV plus assay is intended as an aid in the diagnosis of influenza from Nasopharyngeal swab specimens and should not be used as a sole basis for treatment. Nasal washings and aspirates are unacceptable for Xpert Xpress SARS-CoV-2/FLU/RSV testing.  Fact Sheet for Patients: BloggerCourse.comhttps://www.fda.gov/media/152166/download  Fact Sheet for  Healthcare Providers: SeriousBroker.ithttps://www.fda.gov/media/152162/download  This test is not yet approved or cleared by the [place] FDA and has been authorized for detection and/or diagnosis of SARS-CoV-2 by FDA under an Emergency Use Authorization (EUA). This EUA will remain in effect (meaning this test can be used) for the duration of the COVID-19 declaration under Section 564(b)(1) of the Act, 21 U.S.C. section 360bbb-3(b)(1), unless the authorization is terminated or revoked.  Performed at [facility], [address] [place], [place] [address]   MRSA Next Gen by PCR, Nasal     Status: None   Collection Time: 12/12/20  1:02 AM   Specimen: Nasal Mucosa; Nasal Swab  Result Value Ref Range Status   MRSA by PCR Next Gen NOT DETECTED NOT DETECTED Final    Comment: (NOTE) The GeneXpert MRSA Assay (FDA approved for NASAL specimens only), is one component of a comprehensive MRSA colonization surveillance program. It is not intended to diagnose MRSA infection nor to guide or monitor treatment for MRSA infections. Test performance is not FDA approved in patients less than 57 years old. Performed at [facility], [address] [place], [place] [address]          Radiology Studies: CARDIAC CATHETERIZATION  Addendum Date: 12/12/2020   Conclusions: Inferolateral STEMI due to spontaneous coronary artery dissection of small-moderate caliber branch arising from OM2.  Vessel is too small/distal for intervention. Mild, nonobstructive coronary artery disease involving the LAD and nondominant RCA. Moderately reduced left ventricular systolic function with apical akinesis and otherwise preserved left ventricular ejection fraction.  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 Moderately-severely elevated left ventricular filling pressure consistent with acute heart failure. Recommendations: Admit to ICU. Single antiplatelet therapy  with aspirin 81 mg daily.  Avoid P2Y12 inhibitors and heparin in the setting of spontaneous coronary artery dissection. Obtain echocardiogram. Continue diuresis with furosemide 20 mg IV twice daily (patient responded well to this in the cath lab). Start carvedilol 3.125 mg twice daily and losartan 12.5 mg daily. Initiate nitroglycerin infusion for relief of chest pain.  Oxycodone and/or morphine can be utilized in the short-term for breakthrough pain. Medical therapy and risk factor modification to prevent progression of mild atherosclerotic coronary artery disease also noted. Yvonne Kendallhristopher End, MD [facility]   Result Date: 12/12/2020 Conclusions: Inferolateral STEMI due to spontaneous coronary artery dissection of small-moderate caliber branch arising from OM2.  Vessel is too small/distal for intervention. Mild, nonobstructive coronary artery disease involving the LAD and nondominant RCA. Moderately reduced left ventricular systolic function with apical akinesis and otherwise preserved left ventricular ejection fraction.  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 Moderately-severely elevated left ventricular filling pressure consistent with acute heart failure. Recommendations: Admit to ICU. Single antiplatelet therapy with aspirin 81 mg daily.  Avoid P2Y12 inhibitors and heparin in the  setting of spontaneous coronary artery dissection. Obtain echocardiogram. Continue diuresis with furosemide 20 mg IV twice daily (patient responded well to this in the cath lab). Start carvedilol 3.125 mg twice daily and losartan 12.5 mg daily. Initiate nitroglycerin infusion for relief of chest pain.  Oxycodone and/or morphine can be utilized in the short-term for breakthrough pain. Medical therapy and risk factor modification to prevent progression of mild atherosclerotic coronary artery disease also noted. Yvonne Kendall, MD [facility]  DG Chest Portable 1  View  Result Date: 12/11/2020 CLINICAL DATA:  Chest pain radiating to the right arm. Nausea, shortness of breath, diaphoresis. EXAM: PORTABLE CHEST 1 VIEW COMPARISON:  09/22/2020 FINDINGS: Shallow inspiration. Linear infiltration or atelectasis in the lung bases. No pleural effusions. No pneumothorax. Mediastinal contours appear intact. Heart size is normal. Multiple extrinsic wires and patches projected over the chest limits evaluation. IMPRESSION: Shallow inspiration with infiltration or atelectasis in the lung bases. Electronically Signed   By: Burman Nieves M.D.   On: 12/11/2020 22:56     Scheduled Meds:  aspirin  81 mg Oral Daily   atorvastatin  20 mg Oral Daily   carvedilol  3.125 mg Oral BID WC   Chlorhexidine Gluconate Cloth  6 each Topical Q0600   furosemide  20 mg Intravenous BID   levothyroxine  100 mcg Oral Q0600   losartan  12.5 mg Oral Daily   sodium chloride flush  3 mL Intravenous Q12H   Continuous Infusions:  sodium chloride     nitroGLYCERIN 30 mcg/min (12/12/20 0730)     LOS: 1 day   Time spent:  Azucena Fallen, DO [facility]  If 7PM-7AM, please contact night-coverage [email or web address]  12/12/2020, 8:14 AM

## 2020-12-12 NOTE — Progress Notes (Signed)
Critical Troponin of 6,142, message to Dr.Ducan and Dr.Allred. Awaiting message back.

## 2020-12-12 NOTE — ED Provider Notes (Signed)
[facility]  ____________________________________________   Event Date/Time   First MD Initiated Contact with Patient 12/11/20 2237     (approximate)  I have reviewed the triage vital signs and the nursing notes.   HISTORY  Chief Complaint Chest Pain (STEMI)    HPI Gabrielle Riley is a 55 y.o. female past medical history of hypothyroidism who presents with STEMI from the field.  Onset of symptoms was about half an hour prior to arrival.  Patient was sitting in bed when she developed acute onset of burning sensation in the right side of her chest rating to the right axilla.  Pain has been constant since onset.  She endorses associated dyspnea as well as nausea but no vomiting.  She denies radiation of pain to the back.  Has never had chest pain in the past.  Denies any cardiac history.  Denies illicit drug use.  Does smoke about a half a pack of cigarettes per day.  No fevers chills cough.         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 Authorizing Provider  levothyroxine (SYNTHROID) 100 MCG tablet Take 100 mcg by mouth daily before breakfast.   Yes [provider]  oxyCODONE-acetaminophen (PERCOCET) 10-325 MG tablet Take 1 tablet by mouth every 4 (four) hours as needed for pain.   Yes [provider]    Allergies Imitrex [sumatriptan]  History reviewed. No pertinent family history.  Social History Social History   Tobacco Use   Smoking status: Every Day    Packs/day: 0.50    Years: 25.00    Pack years: 12.50    Types: Cigarettes  Substance Use Topics   Alcohol  use: Not Currently   Drug use: Not Currently    Review of Systems   Review of Systems  Constitutional:  Negative for chills and fever.  Respiratory:  Positive for chest tightness and shortness of breath. Negative for cough.   Cardiovascular:  Positive for chest pain.  Gastrointestinal:  Positive for nausea. Negative for abdominal pain and vomiting.  All other systems reviewed and are negative.  Physical Exam Updated Vital Signs BP (!) 173/99 (BP Location: Left Arm)   Pulse 69   Temp 98.9 F (37.2 C) (Oral)   Resp 14   Ht 5\' 1"  (1.549 m)   Wt 68.9 kg   SpO2 94%   BMI 28.70 kg/m   Physical Exam Vitals and nursing note reviewed.  Constitutional:      General: She is in acute distress.     Appearance: Normal appearance. She is not toxic-appearing.     Comments: Patient appears uncomfortable, clutching her chest  HENT:     Head: Normocephalic and atraumatic.     Nose: Nose normal. No congestion.     Mouth/Throat:     Mouth: Mucous membranes are moist.  Eyes:     General: No scleral icterus.    Conjunctiva/sclera: Conjunctivae normal.  Cardiovascular:     Rate and Rhythm: Normal rate and regular rhythm.     Pulses:  Radial pulses are 2+ on the right side and 2+ on the left side.       Dorsalis pedis pulses are 2+ on the right side and 2+ on the left side.  Pulmonary:     Effort: Pulmonary effort is normal. No respiratory distress.     Breath sounds: Normal breath sounds. No wheezing.  Abdominal:     Palpations: Abdomen is soft.     Tenderness: There is no abdominal tenderness. There is no guarding.  Musculoskeletal:        General: No swelling, tenderness, deformity or signs of injury. Normal range of motion.     Cervical back: Neck supple. No rigidity.  Skin:    General: Skin is warm and dry.     Coloration: Skin is not jaundiced.  Neurological:     General: No focal deficit present.     Mental Status: She is alert and oriented to person, place, and time.   Psychiatric:        Mood and Affect: Mood normal.        Behavior: Behavior normal.     LABS (all labs ordered are listed, but only abnormal results are displayed)  Labs Reviewed  BASIC METABOLIC PANEL - Abnormal; Notable for the following components:      Result Value   Glucose, Bld 114 (*)    BUN 22 (*)    All other components within normal limits  TROPONIN I (HIGH SENSITIVITY) - Abnormal; Notable for the following components:   Troponin I (High Sensitivity) 55 (*)    All other components within normal limits  RESP PANEL BY RT-PCR (FLU A&B, COVID) ARPGX2  MRSA NEXT GEN BY PCR, NASAL  CBC  BASIC METABOLIC PANEL  CBC  CBG MONITORING, ED  POC URINE PREG, ED   ____________________________________________  EKG  ST elevation in the inferior leads with reciprocal depression in 1 and aVL, normal sinus rhythm, normal axis, normal intervals STEMI ____________________________________________  RADIOLOGY I, Randol Kern, personally viewed and evaluated these images (plain radiographs) as part of my medical decision making, as well as reviewing the written report by the radiologist.  ED MD interpretation: I reviewed the chest x-ray which showed shows increased interstitial markings suggestive of pulmonary edema    ____________________________________________   PROCEDURES  Procedure(s) performed (including Critical Care):  Procedures   ____________________________________________   INITIAL IMPRESSION / ASSESSMENT AND PLAN / ED COURSE     55 year old female who presents with a STEMI.  Vital signs notable for hypertension and hypoxia.  Patient requiring 6 L nasal cannula.  On chest x-ray does look like there is some possible pulmonary edema.  Cath Lab activated from the field due to inferior ST elevation with reciprocal changes.  On arrival patient with ongoing chest pain and ongoing EKG changes.  Received aspirin in the field.  Given heparin nitroglycerin and  morphine.  Taken to Cath Lab with Dr. Okey Dupre.      ____________________________________________   FINAL CLINICAL IMPRESSION(S) / ED DIAGNOSES  Final diagnoses:  ST elevation myocardial infarction (STEMI), unspecified artery [facility])     ED Discharge Orders     None        Note:  This document was prepared using Dragon voice recognition software and may include unintentional dictation errors.    Georga Hacking, MD 12/12/20 [phone]

## 2020-12-12 NOTE — Progress Notes (Signed)
*  PRELIMINARY RESULTS* Echocardiogram 2D Echocardiogram has been performed.  Gabrielle Riley 12/12/2020, 9:06 AM

## 2020-12-12 NOTE — H&P (Signed)
History and Physical    Gabrielle Riley DZH:[id number] DOB: [birth year] DOA: 12/11/2020  PCP: [facility]   Patient coming from: home  I have personally briefly reviewed patient's old medical records in [facility]  Chief Complaint: code STEMI s/p cath  HPI: Gabrielle Riley is a 55 y.o. female with medical history significant for Hypothyroidism who is being admitted to the hospitalist service status post cath after presenting as a code STEMI.  Patient developed acute chest pain while at rest at home radiating to the right axilla and back.  Was also noted to have pulmonary edema.  Was evaluated by cardiologist, Dr. 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  Was also noted to have left ventricular dysfunction.  Suspicion for Takotsubo and acute heart failure, per verbal report from Dr. Okey Dupre.  She was started on a nitroglycerin infusion for persistent chest discomfort and transferred from the Cath Lab to stepdown unit in stable condition.  Patient very somnolent at the time of my assessment due to pain medication administered for ongoing pain   Review of Systems: Unable to obtain due to somnolence   Past Medical History:  Diagnosis Date   Thyroid disease     Past Surgical History:  Procedure Laterality Date   ECTOPIC PREGNANCY SURGERY       reports that she has been smoking cigarettes. She has a 12.50 pack-year smoking history. She does not have any smokeless tobacco history on file. She reports current alcohol use of about 1.0 standard drink per week. She reports that she does not currently use drugs.  Allergies  Allergen Reactions   Imitrex [Sumatriptan]     Throat closing    Family history: Unable to obtain due to somnolence   Prior to Admission medications   Medication Sig  Start Date End Date Taking? Authorizing Provider  levothyroxine (SYNTHROID) 100 MCG tablet Take 100 mcg by mouth daily before breakfast.   Yes [provider]  oxyCODONE-acetaminophen (PERCOCET) 10-325 MG tablet Take 1 tablet by mouth every 4 (four) hours as needed for pain.   Yes [provider]    Physical Exam: Vitals:   12/11/20 2245 12/11/20 2300 12/11/20 2313 12/12/20 0015  BP:  (!) 162/93  (!) 173/99  Pulse:  60  69  Resp:  (!) 24  14  Temp:    98.9 F (37.2 C)  TempSrc:    Oral  SpO2:  93% 96% 94%  Weight: 68.9 kg     Height: 5\' 1"  (1.549 m)        Vitals:   12/11/20 2245 12/11/20 2300 12/11/20 2313 12/12/20 0015  BP:  (!) 162/93  (!) 173/99  Pulse:  60  69  Resp:  (!) 24  14  Temp:    98.9 F (37.2 C)  TempSrc:    Oral  SpO2:  93% 96% 94%  Weight: 68.9 kg     Height: 5\' 1"  (1.549 m)         Constitutional: Somnolent, arousable but readily falls back asleep. Not in any apparent distress HEENT:      Head: Normocephalic and atraumatic.         Eyes: PERLA, EOMI, Conjunctivae are normal. Sclera is non-icteric.       Mouth/Throat: Mucous membranes are moist.  Neck: Supple with no signs of meningismus. Cardiovascular: Regular rate and rhythm. No murmurs, gallops, or rubs. 2+ symmetrical distal pulses are present . No JVD. No LE edema Respiratory: Respiratory effort normal .Lungs sounds clear bilaterally. N wheezes, crackles, or rhonchi.  Gastrointestinal: Soft, non tender, and non distended with positive bowel sounds.  Genitourinary: No CVA tenderness. Musculoskeletal: Nontender with normal range of motion in all extremities. No cyanosis, or erythema of extremities. Neurologic:  Face is symmetric. Moving all extremities. No gross focal neurologic deficits . Skin: Skin is warm, dry.  No rash or ulcers Psychiatric: Very somnolent, unable to assess   Labs on Admission: I have personally reviewed following labs and imaging  studies  CBC: Recent Labs  Lab 12/11/20 2234  WBC 7.6  HGB 12.6  HCT 37.2  MCV 91.9  PLT 321   Basic Metabolic Panel: Recent Labs  Lab 12/11/20 2234  NA 138  K 3.7  CL 104  CO2 24  GLUCOSE 114*  BUN 22*  CREATININE 0.83  CALCIUM 9.1   GFR: Estimated Creatinine Clearance: 67.1 mL/min (by C-G formula based on SCr of 0.83 mg/dL). Liver Function Tests: No results for input(s): AST, ALT, ALKPHOS, BILITOT, PROT, ALBUMIN in the last 168 hours. No results for input(s): LIPASE, AMYLASE in the last 168 hours. No results for input(s): AMMONIA in the last 168 hours. Coagulation Profile: No results for input(s): INR, PROTIME in the last 168 hours. Cardiac Enzymes: No results for input(s): CKTOTAL, CKMB, CKMBINDEX, TROPONINI in the last 168 hours. BNP (last 3 results) No results for input(s): PROBNP in the last 8760 hours. HbA1C: No results for input(s): HGBA1C in the last 72 hours. CBG: No results for input(s): GLUCAP in the last 168 hours. Lipid Profile: No results for input(s): CHOL, HDL, LDLCALC, TRIG, CHOLHDL, LDLDIRECT in the last 72 hours. Thyroid Function Tests: No results for input(s): TSH, T4TOTAL, FREET4, T3FREE, THYROIDAB in the last 72 hours. Anemia Panel: No results for input(s): VITAMINB12, FOLATE, FERRITIN, TIBC, IRON, RETICCTPCT in the last 72 hours. Urine analysis: No results found for: COLORURINE, APPEARANCEUR, LABSPEC, PHURINE, GLUCOSEU, HGBUR, BILIRUBINUR, KETONESUR, PROTEINUR, UROBILINOGEN, NITRITE, LEUKOCYTESUR  Radiological Exams on Admission: CARDIAC CATHETERIZATION  Addendum Date: 12/12/2020   Conclusions: Inferolateral STEMI due to spontaneous coronary artery dissection of small-moderate caliber branch arising from OM2.  Vessel is too small/distal for intervention. Mild, nonobstructive coronary artery disease involving the LAD and nondominant RCA. Moderately reduced left ventricular systolic function with apical akinesis and otherwise preserved left  ventricular ejection fraction.  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 Moderately-severely elevated left ventricular filling pressure consistent with acute heart failure. Recommendations: Admit to ICU. Single antiplatelet therapy with aspirin 81 mg daily.  Avoid P2Y12 inhibitors and heparin in the setting of spontaneous coronary artery dissection. Obtain echocardiogram. Continue diuresis with furosemide 20 mg IV twice daily (patient responded well to this in the cath lab). Start carvedilol 3.125 mg twice daily and losartan 12.5 mg daily. Initiate nitroglycerin infusion for relief of chest pain.  Oxycodone and/or morphine can be utilized in the short-term for breakthrough pain. Medical therapy and risk factor modification to prevent progression of mild atherosclerotic coronary artery disease also noted. Yvonne Kendall, MD [facility]   Result Date: 12/12/2020 Conclusions: Inferolateral STEMI due to spontaneous coronary artery dissection of small-moderate caliber branch arising from OM2.  Vessel is too small/distal for intervention. Mild, nonobstructive coronary artery disease involving the LAD and nondominant RCA. Moderately reduced left ventricular systolic function with  apical akinesis and otherwise preserved left ventricular ejection fraction.  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 Moderately-severely elevated left ventricular filling pressure consistent with acute heart failure. Recommendations: Admit to ICU. Single antiplatelet therapy with aspirin 81 mg daily.  Avoid P2Y12 inhibitors and heparin in the setting of spontaneous coronary artery dissection. Obtain echocardiogram. Continue diuresis with furosemide 20 mg IV twice daily (patient responded well to this in the cath lab). Start carvedilol 3.125 mg twice daily and losartan 12.5 mg daily. Initiate nitroglycerin infusion for relief of chest  pain.  Oxycodone and/or morphine can be utilized in the short-term for breakthrough pain. Medical therapy and risk factor modification to prevent progression of mild atherosclerotic coronary artery disease also noted. Yvonne Kendall, MD [facility]  DG Chest Portable 1 View  Result Date: 12/11/2020 CLINICAL DATA:  Chest pain radiating to the right arm. Nausea, shortness of breath, diaphoresis. EXAM: PORTABLE CHEST 1 VIEW COMPARISON:  09/22/2020 FINDINGS: Shallow inspiration. Linear infiltration or atelectasis in the lung bases. No pleural effusions. No pneumothorax. Mediastinal contours appear intact. Heart size is normal. Multiple extrinsic wires and patches projected over the chest limits evaluation. IMPRESSION: Shallow inspiration with infiltration or atelectasis in the lung bases. Electronically Signed   By: Burman Nieves M.D.   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  Vessel is too small/distal for intervention - Recommendations per Dr. 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 Okey Dupre - Carvedilol 3.125 mg twice daily and losartan 12.5 mg daily    DVT prophylaxis: SCDs given dissection Code Status: full code  Family Communication:  none  Disposition  Plan: Back to previous home environment Consults called: Cardiology Status:At the time of admission, it appears that the appropriate admission status for this patient is INPATIENT. This is judged to be reasonable and necessary in order to provide the required intensity of service to ensure the patient's safety given the presenting symptoms, physical exam findings, and initial radiographic and laboratory data in the context of their  Comorbid conditions.   Patient requires inpatient status due to high intensity of service, high risk for further deterioration and high frequency of surveillance required.   I certify that at the point of admission it is my clinical judgment that the patient will require inpatient hospital care spanning beyond 2 midnights     Andris Baumann MD [facility]     12/12/2020, 12:41 AM

## 2020-12-13 ENCOUNTER — Telehealth: Payer: Self-pay | Admitting: *Deleted

## 2020-12-13 ENCOUNTER — Other Ambulatory Visit: Payer: Self-pay

## 2020-12-13 DIAGNOSIS — E785 Hyperlipidemia, unspecified: Secondary | ICD-10-CM

## 2020-12-13 DIAGNOSIS — I2542 Coronary artery dissection: Principal | ICD-10-CM

## 2020-12-13 DIAGNOSIS — F17200 Nicotine dependence, unspecified, uncomplicated: Secondary | ICD-10-CM

## 2020-12-13 DIAGNOSIS — I420 Dilated cardiomyopathy: Secondary | ICD-10-CM

## 2020-12-13 LAB — BASIC METABOLIC PANEL
Anion gap: 10 (ref 5–15)
BUN: 23 mg/dL — ABNORMAL HIGH (ref 6–20)
CO2: 25 mmol/L (ref 22–32)
Calcium: 9.5 mg/dL (ref 8.9–10.3)
Chloride: 102 mmol/L (ref 98–111)
Creatinine, Ser: 0.91 mg/dL (ref 0.44–1.00)
GFR, Estimated: >60 mL/min (ref >60)
Glucose, Bld: 120 mg/dL — ABNORMAL HIGH (ref 70–99)
Potassium: 3.6 mmol/L (ref 3.5–5.1)
Sodium: 137 mmol/L (ref 135–145)

## 2020-12-13 LAB — CBC
HCT: 43 % (ref 36.0–46.0)
Hemoglobin: 14.5 g/dL (ref 12.0–15.0)
MCH: 30 pg (ref 26.0–34.0)
MCHC: 33.7 g/dL (ref 30.0–36.0)
MCV: 88.8 fL (ref 80.0–100.0)
Platelets: 361 10*3/uL (ref 150–400)
RBC: 4.84 MIL/uL (ref 3.87–5.11)
RDW: 14.2 % (ref 11.5–15.5)
WBC: 8.7 10*3/uL (ref 4.0–10.5)
nRBC: 0 % (ref 0.0–0.2)

## 2020-12-13 MED ORDER — LOSARTAN POTASSIUM 25 MG PO TABS
12.5000 mg | ORAL_TABLET | Freq: Every day | ORAL | 0 refills | Status: DC
  Filled 2020-12-13: qty 15, 30d supply, fill #0

## 2020-12-13 MED ORDER — CARVEDILOL 3.125 MG PO TABS
3.1250 mg | ORAL_TABLET | Freq: Two times a day (BID) | ORAL | 0 refills | Status: DC
  Filled 2020-12-13: qty 60, 30d supply, fill #0

## 2020-12-13 MED ORDER — ATORVASTATIN CALCIUM 20 MG PO TABS
20.0000 mg | ORAL_TABLET | Freq: Every day | ORAL | 0 refills | Status: DC
  Filled 2020-12-13: qty 30, 30d supply, fill #0

## 2020-12-13 MED ORDER — FUROSEMIDE 20 MG PO TABS: 20.0000 mg | ORAL_TABLET | Freq: Two times a day (BID) | ORAL | Status: DC

## 2020-12-13 MED ORDER — ASPIRIN 81 MG PO CHEW
81.0000 mg | CHEWABLE_TABLET | Freq: Every day | ORAL | 0 refills | Status: DC
  Filled 2020-12-13: qty 30, 30d supply, fill #0

## 2020-12-13 MED ORDER — FUROSEMIDE 20 MG PO TABS
20.0000 mg | ORAL_TABLET | Freq: Two times a day (BID) | ORAL | 0 refills | Status: DC
  Filled 2020-12-13: qty 30, 15d supply, fill #0

## 2020-12-13 NOTE — Discharge Summary (Signed)
Physician Discharge Summary  Gabrielle Riley WUJ:[id number] DOB: Jul 11, 1965 DOA: 12/11/2020  PCP: [facility]  Admit date: 12/11/2020 Discharge date: 12/13/2020  Admitted From: Home Disposition: Home  Recommendations for Outpatient Follow-up:  Follow up with PCP in 1-2 weeks Please obtain BMP/CBC in one week Please follow up with cardiology/heart failure team as scheduled  Home Health: None Equipment/Devices: None  Discharge Condition: Stable CODE STATUS: Full Diet recommendation: Low-salt low-fat diet, fluid restricted to 2 L/day  Brief/Interim Summary: Gabrielle Riley is a 55 y.o. female with medical history significant for Hypothyroidism who is being admitted to the hospitalist service status post cath after presenting as a code STEMI.  Patient developed acute chest pain while at rest at home radiating to the right axilla and back.  Was also noted to have pulmonary edema.  Was evaluated by cardiologist, Dr. 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  Was also noted to have left ventricular dysfunction.  Suspicion for Takotsubo and acute heart failure, per verbal report from Dr. Okey Dupre.  She was started on a nitroglycerin infusion for persistent chest discomfort and transferred from the Cath Lab to stepdown unit in stable condition.  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  Vessel is too small/distal for intervention - Recommendations per Dr. Okey Dupre : "Single antiplatelet therapy with ASA 81 daily, avoiding P2 Y 12  inhibitors and heparin in the setting of spontaneous coronary artery dissection"  Acute CHF (congestive heart failure) (HCC), ongoing Acute hypoxic respiratory failure, improving Stress-induced cardiomyopathy - LVEF 35 to 45% on cath with findings consistent with stress-induced cardiomyopathy/Takotsubo - Continue core measures including carvedilol, losartan, Lasix -Follow-up with cardiology and heart failure team as scheduled  Discharge Diagnoses:  Principal Problem:   STEMI involving left circumflex coronary artery (HCC) Active Problems:   Spontaneous dissection of coronary artery   Smoker   Hyperlipidemia   Dilated cardiomyopathy [facility])    Discharge Instructions  Discharge Instructions     (HEART FAILURE PATIENTS) Call MD:  Anytime you have any of the following symptoms: 1) 3 pound weight gain in 24 hours or 5 pounds in 1 week 2) shortness of breath, with or without a dry hacking cough 3) swelling in the hands, feet or stomach 4) if you have to sleep on extra pillows at night in order to breathe.   Complete by: As directed    Diet - low sodium heart healthy   Complete by: As directed    Increase activity slowly   Complete by: As directed       Allergies as of 12/13/2020       Reactions   Imitrex [sumatriptan]    Throat closing        Medication List     TAKE these medications    aspirin 81 MG chewable tablet Chew 1 tablet (81 mg total) by mouth daily. Start taking on: December 14, 2020   atorvastatin 20 MG tablet Commonly known as: LIPITOR Take 1 tablet (20 mg total) by mouth daily. Start taking on: December 14, 2020   carvedilol  3.125 MG tablet Commonly known as: COREG Take 1 tablet (3.125 mg total) by mouth 2 (two) times daily with a meal.   furosemide 20 MG tablet Commonly known as: LASIX Take 1 tablet (20 mg total) by mouth 2 (two) times daily.   levothyroxine 100 MCG tablet Commonly known as: SYNTHROID Take 100 mcg by mouth daily before breakfast.    losartan 25 MG tablet Commonly known as: COZAAR Take 0.5 tablets (12.5 mg total) by mouth daily. Start taking on: December 14, 2020   oxyCODONE-acetaminophen 10-325 MG tablet Commonly known as: PERCOCET Take 1 tablet by mouth every 4 (four) hours as needed for pain.        Allergies  Allergen Reactions   Imitrex [Sumatriptan]     Throat closing    Consultations: Cardiology   Procedures/Studies: CARDIAC CATHETERIZATION  Addendum Date: 12/12/2020   Conclusions: Inferolateral STEMI due to spontaneous coronary artery dissection of small-moderate caliber branch arising from OM2.  Vessel is too small/distal for intervention. Mild, nonobstructive coronary artery disease involving the LAD and nondominant RCA. Moderately reduced left ventricular systolic function with apical akinesis and otherwise preserved left ventricular ejection fraction.  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 Moderately-severely elevated left ventricular filling pressure consistent with acute heart failure. Recommendations: Admit to ICU. Single antiplatelet therapy with aspirin 81 mg daily.  Avoid P2Y12 inhibitors and heparin in the setting of spontaneous coronary artery dissection. Obtain echocardiogram. Continue diuresis with furosemide 20 mg IV twice daily (patient responded well to this in the cath lab). Start carvedilol 3.125 mg twice daily and losartan 12.5 mg daily. Initiate nitroglycerin infusion for relief of chest pain.  Oxycodone and/or morphine can be utilized in the short-term for breakthrough pain. Medical therapy and risk factor modification to prevent progression of mild atherosclerotic coronary artery disease also noted. Yvonne Kendall, MD [facility]   Result Date: 12/12/2020 Conclusions: Inferolateral STEMI due to spontaneous coronary artery dissection of small-moderate caliber branch arising from OM2.  Vessel is too small/distal for intervention.  Mild, nonobstructive coronary artery disease involving the LAD and nondominant RCA. Moderately reduced left ventricular systolic function with apical akinesis and otherwise preserved left ventricular ejection fraction.  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 Moderately-severely elevated left ventricular filling pressure consistent with acute heart failure. Recommendations: Admit to ICU. Single antiplatelet therapy with aspirin 81 mg daily.  Avoid P2Y12 inhibitors and heparin in the setting of spontaneous coronary artery dissection. Obtain echocardiogram. Continue diuresis with furosemide 20 mg IV twice daily (patient responded well to this in the cath lab). Start carvedilol 3.125 mg twice daily and losartan 12.5 mg daily. Initiate nitroglycerin infusion for relief of chest pain.  Oxycodone and/or morphine can be utilized in the short-term for breakthrough pain. Medical therapy and risk factor modification to prevent progression of mild atherosclerotic coronary artery disease also noted. Yvonne Kendall, MD [facility]  DG Chest Portable 1 View  Result Date: 12/11/2020 CLINICAL DATA:  Chest pain radiating to the right arm. Nausea, shortness of breath, diaphoresis. EXAM: PORTABLE CHEST 1 VIEW COMPARISON:  09/22/2020 FINDINGS: Shallow inspiration. Linear infiltration or atelectasis in the lung bases. No pleural effusions. No pneumothorax. Mediastinal contours appear intact. Heart size is normal. Multiple extrinsic wires and patches projected over the chest limits evaluation. IMPRESSION: Shallow inspiration with infiltration or atelectasis in the lung bases. Electronically Signed   By: Burman Nieves M.D.   On: 12/11/2020 22:56   ECHOCARDIOGRAM COMPLETE  Result Date:  12/12/2020    ECHOCARDIOGRAM REPORT   Patient Name:   Gabrielle Riley Date of Exam: 12/12/2020 Medical Rec #:  [id number]     Height:       61.0 in Accession #:    [id number]    Weight:        151.9 lb Date of Birth:  01/21/1966     BSA:          1.680 m Patient Age:    54 years      BP:           107/79 mmHg Patient Gender: F             HR:           65 bpm. Exam Location:  [facility] Procedure: 2D Echo, Color Doppler, Cardiac Doppler and Intracardiac            Opacification Agent Indications:     I21.9 Acute myocardial infarction, unspecified  History:         Patient has no prior history of Echocardiogram examinations.                  Signs/Symptoms:Chest Pain.  Sonographer:     Humphrey RollsJoan Heiss Referring Phys:  [phone] CHRISTOPHER END Diagnosing Phys: Debbe OdeaBrian Agbor-Etang MD  Sonographer Comments: Technically difficult study due to poor echo windows. Image acquisition challenging due to breast implants. IMPRESSIONS  1. Hypercontractile LV basal segments with apical hypokinesis suggesting stress induced (Takotsubo) cardiomyopathy or mid-distal LAD disease. . Left ventricular ejection fraction, by estimation, is 45 to 50%. The left ventricle has mildly decreased function. The left ventricle demonstrates regional wall motion abnormalities (see scoring diagram/findings for description). Left ventricular diastolic parameters are consistent with Grade I diastolic dysfunction (impaired relaxation). There is akinesis of the left ventricular, entire apical segment.  2. Right ventricular systolic function is normal. The right ventricular size is normal.  3. The mitral valve is normal in structure. No evidence of mitral valve regurgitation.  4. The aortic valve was not well visualized. Aortic valve regurgitation is not visualized.  5. The inferior vena cava is normal in size with greater than 50% respiratory variability, suggesting right atrial pressure of 3 mmHg. FINDINGS  Left Ventricle: Hypercontractile LV basal segments with apical hypokinesis suggesting stress induced (Takotsubo) cardiomyopathy or mid-distal LAD disease. Left ventricular ejection fraction, by estimation, is 45 to 50%. The left ventricle has mildly  decreased function. The left ventricle demonstrates regional wall motion abnormalities. Definity contrast agent was given IV to delineate the left ventricular endocardial borders. The left ventricular internal cavity size was normal in size. There is no left ventricular hypertrophy. Left ventricular diastolic parameters are consistent with Grade I diastolic dysfunction (impaired relaxation). Right Ventricle: The right ventricular size is normal. No increase in right ventricular wall thickness. Right ventricular systolic function is normal. Left Atrium: Left atrial size was normal in size. Right Atrium: Right atrial size was normal in size. Pericardium: There is no evidence of pericardial effusion. Mitral Valve: The mitral valve is normal in structure. No evidence of mitral valve regurgitation. MV peak gradient, 3.1 mmHg. The mean mitral valve gradient is 1.0 mmHg. Tricuspid Valve: The tricuspid valve is not well visualized. Tricuspid valve regurgitation is not demonstrated. Aortic Valve: The aortic valve was not well visualized. Aortic valve regurgitation is not visualized. Aortic valve mean gradient measures 3.0 mmHg. Aortic valve peak gradient measures 5.4 mmHg. Aortic valve area, by VTI measures 2.06 cm. Pulmonic Valve: The pulmonic valve was  not well visualized. Pulmonic valve regurgitation is not visualized. Aorta: The aortic root is normal in size and structure. Venous: The inferior vena cava is normal in size with greater than 50% respiratory variability, suggesting right atrial pressure of 3 mmHg. IAS/Shunts: No atrial level shunt detected by color flow Doppler.  LEFT VENTRICLE PLAX 2D LVIDd:         4.85 cm  Diastology LVIDs:         2.74 cm  LV e' medial:    3.26 cm/s LV PW:         1.03 cm  LV E/e' medial:  15.2 LV IVS:        0.72 cm  LV e' lateral:   4.79 cm/s LVOT diam:     1.90 cm  LV E/e' lateral: 10.4 LV SV:         45 LV SV Index:   27 LVOT Area:     2.84 cm  LEFT ATRIUM             Index LA  diam:        3.20 cm 1.90 cm/m LA Vol (A2C):   19.1 ml 11.37 ml/m LA Vol (A4C):   31.0 ml 18.45 ml/m LA Biplane Vol: 24.9 ml 14.82 ml/m  AORTIC VALVE                   PULMONIC VALVE AV Area (Vmax):    1.91 cm    PV Vmax:       0.65 m/s AV Area (Vmean):   2.04 cm    PV Vmean:      41.200 cm/s AV Area (VTI):     2.06 cm    PV VTI:        0.091 m AV Vmax:           116.00 cm/s PV Peak grad:  1.7 mmHg AV Vmean:          73.400 cm/s PV Mean grad:  1.0 mmHg AV VTI:            0.220 m AV Peak Grad:      5.4 mmHg AV Mean Grad:      3.0 mmHg LVOT Vmax:         78.30 cm/s LVOT Vmean:        52.900 cm/s LVOT VTI:          0.160 m LVOT/AV VTI ratio: 0.73  AORTA Ao Root diam: 3.00 cm MITRAL VALVE MV Area (PHT): 5.20 cm    SHUNTS MV Area VTI:   2.51 cm    Systemic VTI:  0.16 m MV Peak grad:  3.1 mmHg    Systemic Diam: 1.90 cm MV Mean grad:  1.0 mmHg MV Vmax:       0.88 m/s MV Vmean:      50.4 cm/s MV Decel Time: 146 msec MV E velocity: 49.70 cm/s MV A velocity: 81.80 cm/s MV E/A ratio:  0.61 Debbe Odea MD Electronically signed by Debbe Odea MD Signature Date/Time: 12/12/2020/2:40:51 PM    Final      Subjective: No acute issues or events overnight headache resolved denies nausea vomiting diarrhea constipation headache fevers chills chest pain shortness of breath   Discharge Exam: Vitals:   12/13/20 1300 12/13/20 1400  BP: 99/70   Pulse:    Resp: 13 20  Temp:    SpO2:     Vitals:   12/13/20 1100 12/13/20 1200 12/13/20 1300 12/13/20 1400  BP:  123/82 117/71 99/70   Pulse:      Resp: 14 10 13 20   Temp:      TempSrc:      SpO2:      Weight:      Height:        General: Pt is alert, awake, not in acute distress Cardiovascular: RRR, S1/S2 +, no rubs, no gallops Respiratory: CTA bilaterally, no wheezing, no rhonchi Abdominal: Soft, NT, ND, bowel sounds + Extremities: no edema, no cyanosis    The results of significant diagnostics from this hospitalization (including imaging,  microbiology, ancillary and laboratory) are listed below for reference.     Microbiology: Recent Results (from the past 240 hour(s))  Resp Panel by RT-PCR (Flu A&B, Covid) Nasopharyngeal Swab     Status: None   Collection Time: 12/11/20 10:34 PM   Specimen: Nasopharyngeal Swab; Nasopharyngeal(NP) swabs in vial transport medium  Result Value Ref Range Status   SARS Coronavirus 2 by RT PCR NEGATIVE NEGATIVE Final    Comment: (NOTE) SARS-CoV-2 target nucleic acids are NOT DETECTED.  The SARS-CoV-2 RNA is generally detectable in upper respiratory specimens during the acute phase of infection. The lowest concentration of SARS-CoV-2 viral copies this assay can detect is 138 copies/mL. A negative result does not preclude SARS-Cov-2 infection and should not be used as the sole basis for treatment or other patient management decisions. A negative result may occur with  improper specimen collection/handling, submission of specimen other than nasopharyngeal swab, presence of viral mutation(s) within the areas targeted by this assay, and inadequate number of viral copies(<138 copies/mL). A negative result must be combined with clinical observations, patient history, and epidemiological information. The expected result is Negative.  Fact Sheet for Patients:  12/13/20  Fact Sheet for Healthcare Providers:  BloggerCourse.com  This test is no t yet approved or cleared by the [place] FDA and  has been authorized for detection and/or diagnosis of SARS-CoV-2 by FDA under an Emergency Use Authorization (EUA). This EUA will remain  in effect (meaning this test can be used) for the duration of the COVID-19 declaration under Section 564(b)(1) of the Act, 21 U.S.C.section 360bbb-3(b)(1), unless the authorization is terminated  or revoked sooner.       Influenza A by PCR NEGATIVE NEGATIVE Final   Influenza B by PCR NEGATIVE NEGATIVE  Final    Comment: (NOTE) The Xpert Xpress SARS-CoV-2/FLU/RSV plus assay is intended as an aid in the diagnosis of influenza from Nasopharyngeal swab specimens and should not be used as a sole basis for treatment. Nasal washings and aspirates are unacceptable for Xpert Xpress SARS-CoV-2/FLU/RSV testing.  Fact Sheet for Patients: [place]  Fact Sheet for Healthcare Providers: BloggerCourse.com  This test is not yet approved or cleared by the [employer] FDA and has been authorized for detection and/or diagnosis of SARS-CoV-2 by FDA under an Emergency Use Authorization (EUA). This EUA will remain in effect (meaning this test can be used) for the duration of the COVID-19 declaration under Section 564(b)(1) of the Act, 21 U.S.C. section 360bbb-3(b)(1), unless the authorization is terminated or revoked.  Performed at [facility], [address] [place], [place]   MRSA Next Gen by PCR, Nasal     Status: None   Collection Time: 12/12/20  1:02 AM   Specimen: Nasal Mucosa; Nasal Swab  Result Value Ref Range Status   MRSA by PCR Next Gen NOT DETECTED NOT DETECTED Final    Comment: (NOTE) The GeneXpert MRSA Assay (FDA  approved for NASAL specimens only), is one component of a comprehensive MRSA colonization surveillance program. It is not intended to diagnose MRSA infection nor to guide or monitor treatment for MRSA infections. Test performance is not FDA approved in patients less than 82 years old. Performed at [facility], [address] [place], [place] [address]      Labs: BNP (last 3 results) No results for input(s): BNP in the last 8760 hours. Basic Metabolic Panel: Recent Labs  Lab 12/11/20 2234 12/12/20 0523 12/13/20 0624  NA 138 138 137  K 3.7 3.6 3.6  CL 104 103 102  CO2 24 25 25   GLUCOSE 114* 134* 120*  BUN 22* 20 23*  CREATININE 0.83 0.69 0.91  CALCIUM 9.1 8.9 9.5    Liver Function Tests: No results for input(s): AST, ALT, ALKPHOS, BILITOT, PROT, ALBUMIN in the last 168 hours. No results for input(s): LIPASE, AMYLASE in the last 168 hours. No results for input(s): AMMONIA in the last 168 hours. CBC: Recent Labs  Lab 12/11/20 2234 12/12/20 0523 12/13/20 0624  WBC 7.6 9.8 8.7  HGB 12.6 13.3 14.5  HCT 37.2 40.1 43.0  MCV 91.9 89.1 88.8  PLT 321 329 361   Cardiac Enzymes: No results for input(s): CKTOTAL, CKMB, CKMBINDEX, TROPONINI in the last 168 hours. BNP: Invalid input(s): POCBNP CBG: Recent Labs  Lab 12/12/20 0003  GLUCAP 96   D-Dimer No results for input(s): DDIMER in the last 72 hours. Hgb A1c No results for input(s): HGBA1C in the last 72 hours. Lipid Profile No results for input(s): CHOL, HDL, LDLCALC, TRIG, CHOLHDL, LDLDIRECT in the last 72 hours. Thyroid function studies No results for input(s): TSH, T4TOTAL, T3FREE, THYROIDAB in the last 72 hours.  Invalid input(s): FREET3 Anemia work up No results for input(s): VITAMINB12, FOLATE, FERRITIN, TIBC, IRON, RETICCTPCT in the last 72 hours. Urinalysis No results found for: COLORURINE, APPEARANCEUR, LABSPEC, PHURINE, GLUCOSEU, HGBUR, BILIRUBINUR, KETONESUR, PROTEINUR, UROBILINOGEN, NITRITE, LEUKOCYTESUR Sepsis Labs Invalid input(s): PROCALCITONIN,  WBC,  LACTICIDVEN Microbiology Recent Results (from the past 240 hour(s))  Resp Panel by RT-PCR (Flu A&B, Covid) Nasopharyngeal Swab     Status: None   Collection Time: 12/11/20 10:34 PM   Specimen: Nasopharyngeal Swab; Nasopharyngeal(NP) swabs in vial transport medium  Result Value Ref Range Status   SARS Coronavirus 2 by RT PCR NEGATIVE NEGATIVE Final    Comment: (NOTE) SARS-CoV-2 target nucleic acids are NOT DETECTED.  The SARS-CoV-2 RNA is generally detectable in upper respiratory specimens during the acute phase of infection. The lowest concentration of SARS-CoV-2 viral copies this assay can detect is 138 copies/mL. A  negative result does not preclude SARS-Cov-2 infection and should not be used as the sole basis for treatment or other patient management decisions. A negative result may occur with  improper specimen collection/handling, submission of specimen other than nasopharyngeal swab, presence of viral mutation(s) within the areas targeted by this assay, and inadequate number of viral copies(<138 copies/mL). A negative result must be combined with clinical observations, patient history, and epidemiological information. The expected result is Negative.  Fact Sheet for Patients:  12/13/20  Fact Sheet for Healthcare Providers:  BloggerCourse.com  This test is no t yet approved or cleared by the [employer].[place] FDA and  has been authorized for detection and/or diagnosis of SARS-CoV-2 by FDA under an Emergency Use Authorization (EUA). This EUA will remain  in effect (meaning this test can be used) for the duration of the COVID-19 declaration under Section 564(b)(1) of the  Act, 21 U.S.C.section 360bbb-3(b)(1), unless the authorization is terminated  or revoked sooner.       Influenza A by PCR NEGATIVE NEGATIVE Final   Influenza B by PCR NEGATIVE NEGATIVE Final    Comment: (NOTE) The Xpert Xpress SARS-CoV-2/FLU/RSV plus assay is intended as an aid in the diagnosis of influenza from Nasopharyngeal swab specimens and should not be used as a sole basis for treatment. Nasal washings and aspirates are unacceptable for Xpert Xpress SARS-CoV-2/FLU/RSV testing.  Fact Sheet for Patients: BloggerCourse.com  Fact Sheet for Healthcare Providers: SeriousBroker.it  This test is not yet approved or cleared by the [place] FDA and has been authorized for detection and/or diagnosis of SARS-CoV-2 by FDA under an Emergency Use Authorization (EUA). This EUA will remain in effect (meaning this test can  be used) for the duration of the COVID-19 declaration under Section 564(b)(1) of the Act, 21 U.S.C. section 360bbb-3(b)(1), unless the authorization is terminated or revoked.  Performed at [facility], [address] [place], [place] [address]   MRSA Next Gen by PCR, Nasal     Status: None   Collection Time: 12/12/20  1:02 AM   Specimen: Nasal Mucosa; Nasal Swab  Result Value Ref Range Status   MRSA by PCR Next Gen NOT DETECTED NOT DETECTED Final    Comment: (NOTE) The GeneXpert MRSA Assay (FDA approved for NASAL specimens only), is one component of a comprehensive MRSA colonization surveillance program. It is not intended to diagnose MRSA infection nor to guide or monitor treatment for MRSA infections. Test performance is not FDA approved in patients less than 107 years old. Performed at [facility], [address] [place], [place] [address]      Time coordinating discharge: Over 30 minutes  SIGNED:   Azucena Fallen, DO [facility] 12/13/2020, 2:29 PM Pager   If 7PM-7AM, please contact night-coverage [email or web address]

## 2020-12-13 NOTE — Telephone Encounter (Signed)
The patient is currently admitted. To scheduling to arrange a hospital follow up appointment. Nursing to contact the patient post discharge.

## 2020-12-13 NOTE — Progress Notes (Signed)
Progress Note  Patient Name: Gabrielle Riley Date of Encounter: 12/13/2020  [facility] Cardiologist: Dr. Okey Dupre  Subjective   Reports that she feels well this morning, denies chest pain, no shortness of breath Long discussion concerning recent events, cardiac catheterization results discussed with her, images/coronary diagrams reviewed Reports significant stress, works at [employer], they are looking to start a new company in [place] and she may have to help run, increased stress  Inpatient Medications    Scheduled Meds:  aspirin  81 mg Oral Daily   atorvastatin  20 mg Oral Daily   carvedilol  3.125 mg Oral BID WC   Chlorhexidine Gluconate Cloth  6 each Topical Q0600   furosemide  20 mg Intravenous BID   isosorbide mononitrate  15 mg Oral Daily   levothyroxine  100 mcg Oral Q0600   losartan  12.5 mg Oral Daily   sodium chloride flush  3 mL Intravenous Q12H   Continuous Infusions:  sodium chloride     PRN Meds: sodium chloride, acetaminophen, ondansetron (ZOFRAN) IV, oxyCODONE, sodium chloride flush   Vital Signs    Vitals:   12/13/20 0900 12/13/20 1000 12/13/20 1100 12/13/20 1200  BP: 105/68 94/63 123/82 117/71  Pulse:      Resp: 19 17 14 10   Temp:      TempSrc:      SpO2:      Weight:      Height:        Intake/Output Summary (Last 24 hours) at 12/13/2020 1309 Last data filed at 12/13/2020 0600 Gross per 24 hour  Intake 921.13 ml  Output 850 ml  Net 71.13 ml   Last 3 Weights 12/11/2020  Weight (lbs) 151 lb 14.4 oz  Weight (kg) 68.901 kg      Telemetry    Normal sinus rhythm- Personally Reviewed  ECG    - Personally Reviewed  Physical Exam   GEN: No acute distress.   Neck: No JVD Cardiac: RRR, no murmurs, rubs, or gallops.  Respiratory: Clear to auscultation bilaterally. GI: Soft, nontender, non-distended  MS: No edema; No deformity. Neuro:  Nonfocal  Psych: Normal affect   Labs    High Sensitivity Troponin:   Recent Labs  Lab  12/11/20 2234 12/12/20 0523 12/12/20 1113 12/12/20 1649  TROPONINIHS 55* 6,142* 18,237* 12,988*      Chemistry Recent Labs  Lab 12/11/20 2234 12/12/20 0523 12/13/20 0624  NA 138 138 137  K 3.7 3.6 3.6  CL 104 103 102  CO2 24 25 25   GLUCOSE 114* 134* 120*  BUN 22* 20 23*  CREATININE 0.83 0.69 0.91  CALCIUM 9.1 8.9 9.5  GFRNONAA >60 >60 >60  ANIONGAP 10 10 10      Hematology Recent Labs  Lab 12/11/20 2234 12/12/20 0523 12/13/20 0624  WBC 7.6 9.8 8.7  RBC 4.05 4.50 4.84  HGB 12.6 13.3 14.5  HCT 37.2 40.1 43.0  MCV 91.9 89.1 88.8  MCH 31.1 29.6 30.0  MCHC 33.9 33.2 33.7  RDW 14.2 14.3 14.2  PLT 321 329 361    BNPNo results for input(s): BNP, PROBNP in the last 168 hours.   DDimer No results for input(s): DDIMER in the last 168 hours.   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  Vessel is too small/distal for intervention. Mild, nonobstructive coronary artery disease involving the LAD and nondominant RCA. Moderately reduced  left ventricular systolic function with apical akinesis and otherwise preserved left ventricular ejection fraction.  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 Moderately-severely elevated left ventricular filling pressure consistent with acute heart failure. Recommendations: Admit to ICU. Single antiplatelet therapy with aspirin 81 mg daily.  Avoid P2Y12 inhibitors and heparin in the setting of spontaneous coronary artery dissection. Obtain echocardiogram. Continue diuresis with furosemide 20 mg IV twice daily (patient responded well to this in the cath lab). Start carvedilol 3.125 mg twice daily and losartan 12.5 mg daily. Initiate nitroglycerin infusion for relief of chest pain.  Oxycodone and/or morphine can be utilized in the short-term for  breakthrough pain. Medical therapy and risk factor modification to prevent progression of mild atherosclerotic coronary artery disease also noted. 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  Vessel is too small/distal for intervention. Mild, nonobstructive coronary artery disease involving the LAD and nondominant RCA. Moderately reduced left ventricular systolic function with apical akinesis and otherwise preserved left ventricular ejection fraction.  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 Moderately-severely elevated left ventricular filling pressure consistent with acute heart failure. Recommendations: Admit to ICU. Single antiplatelet therapy with aspirin 81 mg daily.  Avoid P2Y12 inhibitors and heparin in the setting of spontaneous coronary artery dissection. Obtain echocardiogram. Continue diuresis with furosemide 20 mg IV twice daily (patient responded well to this in the cath lab). Start carvedilol 3.125 mg twice daily and losartan 12.5 mg daily. Initiate nitroglycerin infusion for relief of chest pain.  Oxycodone and/or morphine can be utilized in the short-term for breakthrough pain. Medical therapy and risk factor modification to prevent progression of mild atherosclerotic coronary artery disease also noted. Yvonne Kendall, MD [facility]  DG Chest Portable 1 View  Result Date: 12/11/2020 CLINICAL DATA:  Chest pain radiating to the right arm. Nausea, shortness of breath, diaphoresis. EXAM: PORTABLE CHEST 1 VIEW COMPARISON:  09/22/2020 FINDINGS: Shallow inspiration. Linear infiltration or atelectasis in the lung bases. No pleural effusions. No pneumothorax. Mediastinal contours appear intact. Heart size is normal. Multiple extrinsic wires and patches projected over the chest limits evaluation.  IMPRESSION: Shallow inspiration with infiltration or atelectasis in the lung bases. Electronically Signed   By: Burman Nieves M.D.   On: 12/11/2020 22:56   ECHOCARDIOGRAM COMPLETE  Result Date: 12/12/2020    ECHOCARDIOGRAM REPORT   Patient Name:   Gabrielle Riley Date of Exam: 12/12/2020 Medical Rec #:  [id number]     Height:       61.0 in Accession #:    [id number]    Weight:       151.9 lb Date of Birth:  05-14-1965     BSA:          1.680 m Patient Age:    54 years      BP:           107/79 mmHg Patient Gender: F             HR:           65 bpm. Exam Location:  [facility] Procedure: 2D Echo, Color Doppler, Cardiac Doppler and Intracardiac            Opacification Agent Indications:     I21.9 Acute myocardial infarction, unspecified  History:         Patient has no prior history of Echocardiogram examinations.  Signs/Symptoms:Chest Pain.  Sonographer:     Humphrey Rolls Referring Phys:  [phone] CHRISTOPHER END Diagnosing Phys: Debbe Odea MD  Sonographer Comments: Technically difficult study due to poor echo windows. Image acquisition challenging due to breast implants. IMPRESSIONS  1. Hypercontractile LV basal segments with apical hypokinesis suggesting stress induced (Takotsubo) cardiomyopathy or mid-distal LAD disease. . Left ventricular ejection fraction, by estimation, is 45 to 50%. The left ventricle has mildly decreased function. The left ventricle demonstrates regional wall motion abnormalities (see scoring diagram/findings for description). Left ventricular diastolic parameters are consistent with Grade I diastolic dysfunction (impaired relaxation). There is akinesis of the left ventricular, entire apical segment.  2. Right ventricular systolic function is normal. The right ventricular size is normal.  3. The mitral valve is normal in structure. No evidence of mitral valve regurgitation.  4. The aortic valve was not well visualized. Aortic valve regurgitation is not visualized.  5. The  inferior vena cava is normal in size with greater than 50% respiratory variability, suggesting right atrial pressure of 3 mmHg. FINDINGS  Left Ventricle: Hypercontractile LV basal segments with apical hypokinesis suggesting stress induced (Takotsubo) cardiomyopathy or mid-distal LAD disease. Left ventricular ejection fraction, by estimation, is 45 to 50%. The left ventricle has mildly decreased function. The left ventricle demonstrates regional wall motion abnormalities. Definity contrast agent was given IV to delineate the left ventricular endocardial borders. The left ventricular internal cavity size was normal in size. There is no left ventricular hypertrophy. Left ventricular diastolic parameters are consistent with Grade I diastolic dysfunction (impaired relaxation). Right Ventricle: The right ventricular size is normal. No increase in right ventricular wall thickness. Right ventricular systolic function is normal. Left Atrium: Left atrial size was normal in size. Right Atrium: Right atrial size was normal in size. Pericardium: There is no evidence of pericardial effusion. Mitral Valve: The mitral valve is normal in structure. No evidence of mitral valve regurgitation. MV peak gradient, 3.1 mmHg. The mean mitral valve gradient is 1.0 mmHg. Tricuspid Valve: The tricuspid valve is not well visualized. Tricuspid valve regurgitation is not demonstrated. Aortic Valve: The aortic valve was not well visualized. Aortic valve regurgitation is not visualized. Aortic valve mean gradient measures 3.0 mmHg. Aortic valve peak gradient measures 5.4 mmHg. Aortic valve area, by VTI measures 2.06 cm. Pulmonic Valve: The pulmonic valve was not well visualized. Pulmonic valve regurgitation is not visualized. Aorta: The aortic root is normal in size and structure. Venous: The inferior vena cava is normal in size with greater than 50% respiratory variability, suggesting right atrial pressure of 3 mmHg. IAS/Shunts: No atrial level  shunt detected by color flow Doppler.  LEFT VENTRICLE PLAX 2D LVIDd:         4.85 cm  Diastology LVIDs:         2.74 cm  LV e' medial:    3.26 cm/s LV PW:         1.03 cm  LV E/e' medial:  15.2 LV IVS:        0.72 cm  LV e' lateral:   4.79 cm/s LVOT diam:     1.90 cm  LV E/e' lateral: 10.4 LV SV:         45 LV SV Index:   27 LVOT Area:     2.84 cm  LEFT ATRIUM             Index LA diam:        3.20 cm 1.90 cm/m LA Vol (A2C):   19.1  ml 11.37 ml/m LA Vol (A4C):   31.0 ml 18.45 ml/m LA Biplane Vol: 24.9 ml 14.82 ml/m  AORTIC VALVE                   PULMONIC VALVE AV Area (Vmax):    1.91 cm    PV Vmax:       0.65 m/s AV Area (Vmean):   2.04 cm    PV Vmean:      41.200 cm/s AV Area (VTI):     2.06 cm    PV VTI:        0.091 m AV Vmax:           116.00 cm/s PV Peak grad:  1.7 mmHg AV Vmean:          73.400 cm/s PV Mean grad:  1.0 mmHg AV VTI:            0.220 m AV Peak Grad:      5.4 mmHg AV Mean Grad:      3.0 mmHg LVOT Vmax:         78.30 cm/s LVOT Vmean:        52.900 cm/s LVOT VTI:          0.160 m LVOT/AV VTI ratio: 0.73  AORTA Ao Root diam: 3.00 cm MITRAL VALVE MV Area (PHT): 5.20 cm    SHUNTS MV Area VTI:   2.51 cm    Systemic VTI:  0.16 m MV Peak grad:  3.1 mmHg    Systemic Diam: 1.90 cm MV Mean grad:  1.0 mmHg MV Vmax:       0.88 m/s MV Vmean:      50.4 cm/s MV Decel Time: 146 msec MV E velocity: 49.70 cm/s MV A velocity: 81.80 cm/s MV E/A ratio:  0.61 Debbe OdeaBrian Agbor-Etang MD Electronically signed by Debbe OdeaBrian Agbor-Etang MD Signature Date/Time: 12/12/2020/2:40:51 PM    Final     Cardiac Studies   See above  Patient Profile     55 y.o. 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  Assessment & Plan    Inferolateral STEMI Inferolateral ST elevation on EKG on presentation Nonobstructive disease in LAD, RCA -No intervention performed given concern for SCAD Echocardiogram concerning for stress cardiomyopathy, mildly  depressed ejection fraction estimated at 45 to 50% These details discussed on today's rounds, -Medical management recommended Smoking cessation recommended On low-dose carvedilol and ARB, Lipitor, aspirin,  No indication for Plavix or Brilinta Recommend we hold Imdur given hypotension, systolic pressure less than 100  Smoker We have encouraged her to continue to work on weaning her cigarettes and smoking cessation. She will continue to work on this and does not want any assistance with chantix.    Cardiomyopathy Ejection fraction 45 to 50% Akinesis of apex Continue beta-blocker, ARB Consider outpatient cardiac rehab  Reassurance provided, discussed cardiac catheterization results Discussed medications, follow-up in clinic  Total encounter time more than 35 minutes  Greater than 50% was spent in counseling and coordination of care with the patient    For questions or updates, please contact [facility] Please consult [email or web address] for contact info under        Signed, Julien Nordmannimothy Roberta Kelly, MD  12/13/2020, 1:09 PM

## 2020-12-13 NOTE — Consult Note (Signed)
   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  Hypercontractility of the basal segments with apical hypokinesis suggesting Takotsubo's cardiomyopathy.  Grade 1 diastolic function.  Normal right ventricular systolic function.  She presented to the emergency room with complaints of acute chest pain.  Comorbidities:  Tobacco abuse  Medications:  Aspirin 81 mg daily Atorvastatin 20 mg daily Carvedilol 3.125 mg 2 times a day Furosemide 20 mg IV 2 times a day Isosorbide mononitrate 15 mg Losartan 12.5 mg daily  Labs:  Sodium 137, potassium 3.6, chloride 102, CO2 25, BUN 23, creatinine 0.9 Weight is 68.9 kg Blood pressure 117/71  Initial meeting with patient today.  Lying in the ICU in no acute distress currently on room air.  She states that she has a very stressful job and can get calls 24 hours a day.  States that she also just lost her dog which she called her close companion over 11 years.  Discussed the importance of weighing daily and to report weight gain of 2 pounds overnight or 5 pounds in a week or changes in symptoms where she is more short of lower extremity edema etc.  Discussed her medications carvedilol, Lasix and losartan.   She has follow-up in the outpatient heart failure clinic.    She was given the living with heart failure teaching booklet.  Tresa Endo RN [facility]

## 2020-12-13 NOTE — Telephone Encounter (Signed)
LMOV  

## 2020-12-13 NOTE — Telephone Encounter (Signed)
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 Please call can arrange. Thx!

## 2020-12-17 ENCOUNTER — Telehealth: Payer: Self-pay | Admitting: Pharmacist

## 2020-12-17 ENCOUNTER — Other Ambulatory Visit: Payer: Self-pay

## 2020-12-17 NOTE — Telephone Encounter (Signed)
Called Ms. Ladnier today at 11:30 am to discuss her discharge medications. Received an e-mail from the nursing supervisor that the patient had called over the weekend. The patients discharge meds were filled at [facility] on 12/13/20, however, she was not able to pick them up. Rx's were then called into [facility] in [place] over the past weekend. I wanted to follow up with her to understand the situation. While speaking with her, she inquired about a Percocet prescription. Called her back at 2:45 pm to let her know that Dr. Natale Milch did not send a new Percocet Rx to the pharmacy. She did not have surgery, trauma or a procedure that warranted a narcotic, so she will need to follow up with her provider. Patient voiced understanding.  Rashiya Lofland K. Joelene Millin, PharmD [facility]-Pharmacy Operations Coordinator [phone]

## 2020-12-17 NOTE — Telephone Encounter (Signed)
Pt discharged. Scheduling attempted to call pt. Lmtcb.  Pending follow up appointment, nurse to contact pt for TCM call.

## 2020-12-18 ENCOUNTER — Telehealth: Payer: Self-pay

## 2020-12-18 NOTE — Telephone Encounter (Signed)
[facility] Discharge Phone Call  Post discharge phone call to patient.  States that she feels she is doing well.  Had been weighing herself daily up until today the battery on her scale died.  Boyfriend is bringing her a new battery this evening.  She has gotten her medications [facility] and is taking as ordered.  States that she has read up on the medication felt that this was for older patients with blockages.  Explained to her that these are that best medications for somebody that has "heart failure" with the decline in her ejection fraction of 35 to 45%.  Also discussed with staying on this medications and with it felt to be due to Takotsubo cardiomyopathy that the physicians will look again at her heart function with the echocardiogram and more times than not we see an improvement.  She voices understanding.   She states that she has not had any weight gain, swelling, increasing shortness of breath.  She states that she is working from home.  Discussed her follow-up ointment with [name] NP at the heart failure clinic this Friday.  States that she is looking forward to meeting her.  Tresa Endo RN [facility]

## 2020-12-21 ENCOUNTER — Other Ambulatory Visit: Payer: Self-pay

## 2020-12-21 ENCOUNTER — Encounter: Payer: Self-pay | Admitting: Family

## 2020-12-21 ENCOUNTER — Other Ambulatory Visit
Admission: RE | Admit: 2020-12-21 | Discharge: 2020-12-21 | Disposition: A | Discharge status: Home / Self Care | Payer: Medicaid Other | Source: Ambulatory Visit | Attending: Family | Admitting: Family

## 2020-12-21 ENCOUNTER — Ambulatory Visit: Payer: Medicaid Other | Attending: Family | Admitting: Family

## 2020-12-21 VITALS — BP 113/70 | HR 88 | Resp 20 | Ht 61.0 in | Wt 141.4 lb

## 2020-12-21 DIAGNOSIS — I5022 Chronic systolic (congestive) heart failure: Secondary | ICD-10-CM | POA: Insufficient documentation

## 2020-12-21 DIAGNOSIS — Z79899 Other long term (current) drug therapy: Secondary | ICD-10-CM | POA: Insufficient documentation

## 2020-12-21 DIAGNOSIS — I5181 Takotsubo syndrome: Secondary | ICD-10-CM | POA: Insufficient documentation

## 2020-12-21 DIAGNOSIS — Z7901 Long term (current) use of anticoagulants: Secondary | ICD-10-CM | POA: Insufficient documentation

## 2020-12-21 DIAGNOSIS — M79642 Pain in left hand: Secondary | ICD-10-CM | POA: Insufficient documentation

## 2020-12-21 DIAGNOSIS — I5032 Chronic diastolic (congestive) heart failure: Secondary | ICD-10-CM | POA: Insufficient documentation

## 2020-12-21 DIAGNOSIS — Z7982 Long term (current) use of aspirin: Secondary | ICD-10-CM | POA: Insufficient documentation

## 2020-12-21 DIAGNOSIS — I2510 Atherosclerotic heart disease of native coronary artery without angina pectoris: Secondary | ICD-10-CM | POA: Insufficient documentation

## 2020-12-21 DIAGNOSIS — F17210 Nicotine dependence, cigarettes, uncomplicated: Secondary | ICD-10-CM | POA: Insufficient documentation

## 2020-12-21 DIAGNOSIS — I252 Old myocardial infarction: Secondary | ICD-10-CM | POA: Insufficient documentation

## 2020-12-21 DIAGNOSIS — I2121 ST elevation (STEMI) myocardial infarction involving left circumflex coronary artery: Secondary | ICD-10-CM

## 2020-12-21 DIAGNOSIS — Z720 Tobacco use: Secondary | ICD-10-CM

## 2020-12-21 LAB — CBC WITH DIFFERENTIAL/PLATELET
Abs Immature Granulocytes: 0.03 10*3/uL (ref 0.00–0.07)
Basophils Absolute: 0.1 10*3/uL (ref 0.0–0.1)
Basophils Relative: 1 %
Eosinophils Absolute: 0.1 10*3/uL (ref 0.0–0.5)
Eosinophils Relative: 2 %
HCT: 37.7 % (ref 36.0–46.0)
Hemoglobin: 12.4 g/dL (ref 12.0–15.0)
Immature Granulocytes: 0 %
Lymphocytes Relative: 27 %
Lymphs Abs: 1.9 10*3/uL (ref 0.7–4.0)
MCH: 30 pg (ref 26.0–34.0)
MCHC: 32.9 g/dL (ref 30.0–36.0)
MCV: 91.3 fL (ref 80.0–100.0)
Monocytes Absolute: 0.7 10*3/uL (ref 0.1–1.0)
Monocytes Relative: 9 %
Neutro Abs: 4.4 10*3/uL (ref 1.7–7.7)
Neutrophils Relative %: 61 %
Platelets: 376 10*3/uL (ref 150–400)
RBC: 4.13 MIL/uL (ref 3.87–5.11)
RDW: 13.7 % (ref 11.5–15.5)
WBC: 7.2 10*3/uL (ref 4.0–10.5)
nRBC: 0 % (ref 0.0–0.2)

## 2020-12-21 LAB — BASIC METABOLIC PANEL
Anion gap: 4 — ABNORMAL LOW (ref 5–15)
BUN: 28 mg/dL — ABNORMAL HIGH (ref 6–20)
CO2: 32 mmol/L (ref 22–32)
Calcium: 9.1 mg/dL (ref 8.9–10.3)
Chloride: 103 mmol/L (ref 98–111)
Creatinine, Ser: 0.82 mg/dL (ref 0.44–1.00)
GFR, Estimated: >60 mL/min (ref >60)
Glucose, Bld: 97 mg/dL (ref 70–99)
Potassium: 4.1 mmol/L (ref 3.5–5.1)
Sodium: 139 mmol/L (ref 135–145)

## 2020-12-21 MED ORDER — ATORVASTATIN CALCIUM 20 MG PO TABS
20.0000 mg | ORAL_TABLET | Freq: Every day | ORAL | 3 refills | Status: DC
  Filled 2020-12-21: qty 90, 90d supply, fill #0

## 2020-12-21 MED ORDER — CARVEDILOL 3.125 MG PO TABS
3.1250 mg | ORAL_TABLET | Freq: Two times a day (BID) | ORAL | 3 refills | Status: DC
  Filled 2020-12-21: qty 180, 90d supply, fill #0

## 2020-12-21 MED ORDER — LOSARTAN POTASSIUM 25 MG PO TABS
12.5000 mg | ORAL_TABLET | Freq: Every day | ORAL | 3 refills | Status: DC
  Filled 2020-12-21: qty 90, 180d supply, fill #0

## 2020-12-21 MED ORDER — FUROSEMIDE 20 MG PO TABS
20.0000 mg | ORAL_TABLET | Freq: Every day | ORAL | 3 refills | Status: DC
  Filled 2020-12-21: qty 90, 90d supply, fill #0
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
## 2020-12-21 NOTE — Patient Instructions (Addendum)
Begin weighing daily and call for an overnight weight gain of > 2 pounds or a weekly weight gain of >5 pounds. 

## 2020-12-25 NOTE — Telephone Encounter (Signed)
Patient contacted regarding discharge from [facility] on 12/13/20.  Patient understands to follow up with provider ? On (12/28/20 at 8:00 AM at [facility] office.  Patient understands discharge instructions? Yes Patient understands medications and regiment? Yes Patient understands to bring all medications to this visit? Yes  Pt has no further questions or concerns.

## 2020-12-28 ENCOUNTER — Ambulatory Visit (INDEPENDENT_AMBULATORY_CARE_PROVIDER_SITE_OTHER): Payer: Self-pay | Admitting: Internal Medicine

## 2020-12-28 ENCOUNTER — Other Ambulatory Visit: Payer: Self-pay

## 2020-12-28 ENCOUNTER — Encounter: Payer: Self-pay | Admitting: Internal Medicine

## 2020-12-28 VITALS — BP 90/70 | HR 93 | Ht 61.0 in | Wt 145.0 lb

## 2020-12-28 DIAGNOSIS — Z720 Tobacco use: Secondary | ICD-10-CM

## 2020-12-28 DIAGNOSIS — I255 Ischemic cardiomyopathy: Secondary | ICD-10-CM

## 2020-12-28 DIAGNOSIS — I2542 Coronary artery dissection: Secondary | ICD-10-CM

## 2020-12-28 DIAGNOSIS — I2121 ST elevation (STEMI) myocardial infarction involving left circumflex coronary artery: Secondary | ICD-10-CM

## 2020-12-28 DIAGNOSIS — I5022 Chronic systolic (congestive) heart failure: Secondary | ICD-10-CM

## 2020-12-28 DIAGNOSIS — E785 Hyperlipidemia, unspecified: Secondary | ICD-10-CM

## 2020-12-28 MED ORDER — NITROGLYCERIN 0.4 MG SL SUBL: 0.4000 mg | SUBLINGUAL_TABLET | SUBLINGUAL | 3 refills | Status: DC | PRN

## 2020-12-28 NOTE — Progress Notes (Signed)
Follow-up Outpatient Visit Date: 12/28/2020  Primary Care Provider: [facility] [address] [place] [address]  Chief Complaint: Follow-up STEMI  HPI:  Ms. Thole is a 54 y.o. female with history of history of inferolateral STEMI secondary to spontaneous coronary artery dissection of distal OM branches (12/11/2020), thyroid disease, and tobacco use, who presents for follow-up of spontaneous coronary artery dissection.  She presented to the [facility] emergency department on 12/11/2020 with sudden onset of burning and pressure in the center of her chest radiating to the left axilla accompanied by shortness of breath and transient nausea.  EKG showed inferolateral ST elevation.  Emergent catheterization spontaneous coronary artery dissection of a small-moderate caliber OM 2 branch with occlusion of the distal vessel.  Vessel was too small for PCI.  LVEF was moderately reduced with moderately-severely elevated filling pressure.  Patient was managed medically with carvedilol, losartan, and nitroglycerin infusion with resolution of symptoms.  Today, Ms. Lachapelle reports that she is feeling quite well.  Her only complaint is that she seems to urinate a lot after taking her diuretic.  She also notes a little bit of aching in her left forearm, which she had fractured earlier in the year.  She denies chest pain, shortness of breath, palpitations, lightheadedness, and edema.  --------------------------------------------------------------------------------------------------  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   Thyroid disease    Past Surgical History:  Procedure Laterality Date   CARDIAC CATHETERIZATION     ECTOPIC PREGNANCY SURGERY     LEFT HEART CATH AND  CORONARY ANGIOGRAPHY N/A 12/11/2020   Procedure: LEFT HEART CATH AND CORONARY ANGIOGRAPHY;  Surgeon: Yvonne Kendall, MD;  Location: [facility] INVASIVE CV LAB;  Service: Cardiovascular;  Laterality: N/A;     Recent CV Pertinent Labs: Lab Results  Component Value Date   K 4.1 12/21/2020   BUN 28 (H) 12/21/2020   CREATININE 0.82 12/21/2020    Past medical and surgical history were reviewed and updated in EPIC.  Current Meds  Medication Sig   aspirin 81 MG chewable tablet Chew 1 tablet (81 mg total) by mouth once daily.   atorvastatin (LIPITOR) 20 MG tablet Take 1 tablet (20 mg total) by mouth once daily.   carvedilol (COREG) 3.125 MG tablet Take 1 tablet (3.125 mg total) by mouth 2 (two) times daily with a meal.   furosemide (LASIX) 20 MG tablet Take 1 tablet (20 mg total) by mouth once daily.   levothyroxine (SYNTHROID) 100 MCG tablet Take 100 mcg by mouth daily before breakfast.   losartan (COZAAR) 25 MG tablet Take (1/2) tablet (12.5 mg total) by mouth once daily.    Allergies: Imitrex [sumatriptan] and Sulfa antibiotics  Social History   Tobacco Use   Smoking status: Every Day    Packs/day: 0.50    Years: 25.00    Pack years: 12.50    Types: Cigarettes   Smokeless tobacco: Never  Vaping Use   Vaping Use: Never used  Substance Use Topics   Alcohol use: Not Currently    Comment: 1-2 drinks occassionally   Drug use: Not Currently    Family History  Problem Relation Age of Onset   Diabetes Sister    Heart attack Brother     Review of Systems: A 12-system  review of systems was performed and was negative except as noted in the HPI.  --------------------------------------------------------------------------------------------------  Physical Exam: BP 90/70 (BP Location: Left Arm, Patient Position: Sitting, Cuff Size: Normal)   Pulse 93   Ht 5\' 1"  (1.549 m)   Wt 145 lb (65.8 kg)   BMI 27.40 kg/m   General:  NAD. Neck: No JVD or HJR. Lungs: Clear to auscultation  bilaterally without wheezes or crackles. Heart: Regular rate and rhythm without murmurs, rubs, or gallops. Abdomen: Soft, nontender, nondistended. Extremities: No lower extremity edema.  EKG: Normal sinus rhythm with anterolateral and inferior T wave inversions, low voltage, and mild QT prolongation.  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 Allebach has recovered well from this without any recurrent angina.  She is tolerating her medications well with the exception of increased urinary frequency on furosemide.  We will plan to continue aspirin 81 mg daily, carvedilol 3.125 mg twice daily, and atorvastatin 20 mg daily.  Given association between spontaneous coronary artery dissection and fibromuscular dysplasia, we will obtain a renal artery Doppler.  I will provide her with a prescription for sublingual nitroglycerin to take as needed for chest pain.  We will also refer her to cardiac rehab.  HFrEF and ischemic cardiomyopathy: LVEF noted to be mildly to moderately reduced following STEMI with inferolateral wall motion abnormality.  Patient appears euvolemic on exam today.  Given soft blood pressure (asymptomatic), we are unable to escalate her GDMT consisting of carvedilol and losartan.  Continue low-dose furosemide, though we could consider transitioning this to as needed in the future.  We will repeat a  limited echocardiogram to reassess LVEF 6-8 weeks after her presenting event.  Hyperlipidemia: Lipids suboptimally controlled on last check in 03/2020.  Continue atorvastatin with plans for repeat lipid panel in 2 to 3 months.  Tobacco use: Continue to work on smoking cessation.  Follow-up: Return to clinic in late October after echocardiogram and renal artery Doppler.  November, MD 12/28/2020 8:54 AM

## 2020-12-28 NOTE — Patient Instructions (Addendum)
Medication Instructions:  -Your physician has recommended you make the following change in your medication:   1) START Nitroglycerin 0.4 mg- take 1 tablet every 5 minutes up to 3 doses in a 15 minute period as needed for chest pain   *If you need a refill on your cardiac medications before your next appointment, please call your pharmacy*   Lab Work: - none ordered  If you have labs (blood work) drawn today and your tests are completely normal, you will receive your results only by: MyChart Message (if you have MyChart) OR A paper copy in the mail If you have any lab test that is abnormal or we need to change your treatment, we will call you to review the results.   Testing/Procedures:  1) Renal Artery Ultrasound: - Your physician has requested that you have a renal artery duplex. During this test, an ultrasound is used to evaluate blood flow to the kidneys. Allow one hour for this exam. Do not eat after midnight the day before and avoid carbonated beverages. Take your medications as you usually do.  2) Limited Echocardiogram: -  Your physician has requested that you have an echocardiogram. Echocardiography is a painless test that uses sound waves to create images of your heart. It provides your doctor with information about the size and shape of your heart and how well your heart's chambers and valves are working. This procedure takes approximately one hour. There are no restrictions for this procedure. There is a possibility that an IV may need to be started during your test to inject an image enhancing agent. This is done to obtain more optimal pictures of your heart. Therefore we ask that you do at least drink some water prior to coming in to hydrate your veins.    3) You have been referred to Cardiac Rehab: You have been referred to cardiac rehab. This is a combination program including monitored exercise, dietary education, and support group. We strongly recommend participating in  the program. Expect a phone call from them in approximately 1-2 weeks. If you do not hear from them, the phone number for cardiac rehab at [facility] is [phone].    Follow-Up: At [facility], you and your health needs are our priority.  As part of our continuing mission to provide you with exceptional heart care, we have created designated Provider Care Teams.  These Care Teams include your primary Cardiologist (physician) and Advanced Practice Providers (APPs -  Physician Assistants and Nurse Practitioners) who all work together to provide you with the care you need, when you need it.  We recommend signing up for the patient portal called "MyChart".  Sign up information is provided on this After Visit Summary.  MyChart is used to connect with patients for Virtual Visits (Telemedicine).  Patients are able to view lab/test results, encounter notes, upcoming appointments, etc.  Non-urgent messages can be sent to your provider as well.   To learn more about what you can do with MyChart, go to [email or web address].    Your next appointment:   Late October   The format for your next appointment:   In Person  Provider:   You may see Yvonne Kendall, MD or one of the following Advanced Practice Providers on your designated Care Team:   Nicolasa Ducking, NP Eula Listen, PA-C Marisue Ivan, PA-C Cadence Fransico Michael, [place]   Other Instructions

## 2020-12-29 ENCOUNTER — Encounter: Payer: Self-pay | Admitting: Internal Medicine

## 2020-12-29 DIAGNOSIS — Z720 Tobacco use: Secondary | ICD-10-CM | POA: Insufficient documentation

## 2020-12-29 DIAGNOSIS — I5022 Chronic systolic (congestive) heart failure: Secondary | ICD-10-CM | POA: Insufficient documentation

## 2020-12-29 DIAGNOSIS — I255 Ischemic cardiomyopathy: Secondary | ICD-10-CM | POA: Insufficient documentation

## 2021-01-06 ENCOUNTER — Emergency Department
Admission: EM | Admit: 2021-01-06 | Discharge: 2021-01-06 | Disposition: A | Discharge status: Home / Self Care | Payer: Medicaid Other | Attending: Emergency Medicine | Admitting: Emergency Medicine

## 2021-01-06 ENCOUNTER — Other Ambulatory Visit: Payer: Self-pay

## 2021-01-06 DIAGNOSIS — Z5321 Procedure and treatment not carried out due to patient leaving prior to being seen by health care provider: Secondary | ICD-10-CM | POA: Insufficient documentation

## 2021-01-06 DIAGNOSIS — R519 Headache, unspecified: Secondary | ICD-10-CM | POA: Insufficient documentation

## 2021-01-06 DIAGNOSIS — Y92410 Unspecified street and highway as the place of occurrence of the external cause: Secondary | ICD-10-CM | POA: Insufficient documentation

## 2021-01-06 DIAGNOSIS — M25511 Pain in right shoulder: Secondary | ICD-10-CM | POA: Insufficient documentation

## 2021-01-06 DIAGNOSIS — M25561 Pain in right knee: Secondary | ICD-10-CM | POA: Insufficient documentation
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
## 2021-01-07 ENCOUNTER — Emergency Department
Admission: EM | Admit: 2021-01-07 | Discharge: 2021-01-07 | Disposition: A | Discharge status: Home / Self Care | Payer: Medicaid Other | Attending: Emergency Medicine | Admitting: Emergency Medicine

## 2021-01-07 ENCOUNTER — Encounter: Payer: Self-pay | Admitting: Emergency Medicine

## 2021-01-07 DIAGNOSIS — F17210 Nicotine dependence, cigarettes, uncomplicated: Secondary | ICD-10-CM | POA: Insufficient documentation

## 2021-01-07 DIAGNOSIS — I509 Heart failure, unspecified: Secondary | ICD-10-CM | POA: Insufficient documentation

## 2021-01-07 DIAGNOSIS — I2510 Atherosclerotic heart disease of native coronary artery without angina pectoris: Secondary | ICD-10-CM | POA: Insufficient documentation

## 2021-01-07 DIAGNOSIS — R519 Headache, unspecified: Secondary | ICD-10-CM | POA: Insufficient documentation

## 2021-01-07 DIAGNOSIS — Y92410 Unspecified street and highway as the place of occurrence of the external cause: Secondary | ICD-10-CM | POA: Insufficient documentation

## 2021-01-07 DIAGNOSIS — M542 Cervicalgia: Secondary | ICD-10-CM | POA: Insufficient documentation

## 2021-01-07 DIAGNOSIS — Z7982 Long term (current) use of aspirin: Secondary | ICD-10-CM | POA: Insufficient documentation

## 2021-01-07 MED ORDER — HYDROCODONE-ACETAMINOPHEN 5-325 MG PO TABS: 1.0000 | ORAL_TABLET | ORAL | 0 refills | Status: DC | PRN
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
## 2021-01-17 ENCOUNTER — Telehealth: Payer: Self-pay | Admitting: Internal Medicine

## 2021-01-17 MED ORDER — ATORVASTATIN CALCIUM 20 MG PO TABS: 20.0000 mg | ORAL_TABLET | Freq: Every day | ORAL | 0 refills | Status: DC

## 2021-01-17 MED ORDER — CARVEDILOL 3.125 MG PO TABS: 3.1250 mg | ORAL_TABLET | Freq: Two times a day (BID) | ORAL | 0 refills | Status: DC

## 2021-01-17 MED ORDER — LOSARTAN POTASSIUM 25 MG PO TABS: 12.5000 mg | ORAL_TABLET | Freq: Every day | ORAL | 0 refills | Status: DC

## 2021-01-17 MED ORDER — FUROSEMIDE 20 MG PO TABS: 20.0000 mg | ORAL_TABLET | Freq: Every day | ORAL | 0 refills | Status: DC

## 2021-01-17 NOTE — Telephone Encounter (Signed)
*  STAT* If patient is at the pharmacy, call can be transferred to refill team.   1. Which medications need to be refilled? (please list name of each medication and dose if known)  All cardiac medications (patient at work and does not know which ones)  2. Which pharmacy/location (including street and city if local pharmacy) is medication to be sent to? [facility] [address] [facility] [place]   3. Do they need a 30 day or 90 day supply? 90 day

## 2021-01-17 NOTE — Telephone Encounter (Signed)
Requested Prescriptions   Signed Prescriptions Disp Refills   losartan (COZAAR) 25 MG tablet 45 tablet 0    Sig: Take (1/2) tablet (12.5 mg total) by mouth once daily.    Authorizing Provider: END, CHRISTOPHER    Ordering User: NEWCOMER MCCLAIN, Jyoti Harju L   atorvastatin (LIPITOR) 20 MG tablet 90 tablet 0    Sig: Take 1 tablet (20 mg total) by mouth once daily.    Authorizing Provider: END, CHRISTOPHER    Ordering User: NEWCOMER MCCLAIN, Marquett Bertoli L   carvedilol (COREG) 3.125 MG tablet 180 tablet 0    Sig: Take 1 tablet (3.125 mg total) by mouth 2 (two) times daily with a meal.    Authorizing Provider: END, CHRISTOPHER    Ordering User: NEWCOMER MCCLAIN, Roczen Waymire L   furosemide (LASIX) 20 MG tablet 90 tablet 0    Sig: Take 1 tablet (20 mg total) by mouth once daily.    Authorizing Provider: END, CHRISTOPHER    Ordering User: Thayer Headings, Caeson Filippi L
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
## 2021-01-28 ENCOUNTER — Ambulatory Visit: Payer: Medicaid Other | Admitting: Family

## 2021-01-29 ENCOUNTER — Ambulatory Visit: Payer: Medicaid Other | Attending: Family | Admitting: Family

## 2021-01-29 ENCOUNTER — Encounter: Payer: Self-pay | Admitting: Family

## 2021-01-29 ENCOUNTER — Other Ambulatory Visit: Payer: Self-pay

## 2021-01-29 VITALS — BP 130/72 | HR 68 | Resp 16 | Ht 61.0 in | Wt 145.1 lb

## 2021-01-29 DIAGNOSIS — Z720 Tobacco use: Secondary | ICD-10-CM

## 2021-01-29 DIAGNOSIS — I2121 ST elevation (STEMI) myocardial infarction involving left circumflex coronary artery: Secondary | ICD-10-CM

## 2021-01-29 DIAGNOSIS — I2542 Coronary artery dissection: Secondary | ICD-10-CM | POA: Insufficient documentation

## 2021-01-29 DIAGNOSIS — I5181 Takotsubo syndrome: Secondary | ICD-10-CM | POA: Insufficient documentation

## 2021-01-29 DIAGNOSIS — I2510 Atherosclerotic heart disease of native coronary artery without angina pectoris: Secondary | ICD-10-CM | POA: Insufficient documentation

## 2021-01-29 DIAGNOSIS — Z716 Tobacco abuse counseling: Secondary | ICD-10-CM | POA: Insufficient documentation

## 2021-01-29 DIAGNOSIS — Z7982 Long term (current) use of aspirin: Secondary | ICD-10-CM | POA: Insufficient documentation

## 2021-01-29 DIAGNOSIS — I252 Old myocardial infarction: Secondary | ICD-10-CM | POA: Insufficient documentation

## 2021-01-29 DIAGNOSIS — Z882 Allergy status to sulfonamides status: Secondary | ICD-10-CM | POA: Insufficient documentation

## 2021-01-29 DIAGNOSIS — Z8249 Family history of ischemic heart disease and other diseases of the circulatory system: Secondary | ICD-10-CM | POA: Insufficient documentation

## 2021-01-29 DIAGNOSIS — Z79899 Other long term (current) drug therapy: Secondary | ICD-10-CM | POA: Insufficient documentation

## 2021-01-29 DIAGNOSIS — I5022 Chronic systolic (congestive) heart failure: Secondary | ICD-10-CM | POA: Insufficient documentation

## 2021-01-29 DIAGNOSIS — Z79890 Hormone replacement therapy (postmenopausal): Secondary | ICD-10-CM | POA: Insufficient documentation

## 2021-01-29 DIAGNOSIS — F17210 Nicotine dependence, cigarettes, uncomplicated: Secondary | ICD-10-CM | POA: Insufficient documentation

## 2021-01-29 NOTE — Patient Instructions (Signed)
Continue weighing daily and call for an overnight weight gain of > 2 pounds or a weekly weight gain of >5 pounds. 
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
## 2021-01-30 ENCOUNTER — Other Ambulatory Visit: Payer: Self-pay | Admitting: Family

## 2021-01-30 ENCOUNTER — Other Ambulatory Visit: Payer: Self-pay

## 2021-01-30 MED ORDER — CARVEDILOL 3.125 MG PO TABS
3.1250 mg | ORAL_TABLET | Freq: Two times a day (BID) | ORAL | 3 refills | Status: DC
Start: 2021-01-30 — End: 2021-05-28
  Filled 2021-01-30: qty 180, 90d supply, fill #0

## 2021-01-30 MED ORDER — LOSARTAN POTASSIUM 25 MG PO TABS
12.5000 mg | ORAL_TABLET | Freq: Every day | ORAL | 3 refills | Status: DC
  Filled 2021-01-30: qty 45, 90d supply, fill #0

## 2021-01-30 MED ORDER — FUROSEMIDE 20 MG PO TABS
20.0000 mg | ORAL_TABLET | Freq: Every day | ORAL | 3 refills | Status: DC
  Filled 2021-01-30: qty 90, 90d supply, fill #0

## 2021-01-30 MED ORDER — ATORVASTATIN CALCIUM 20 MG PO TABS
20.0000 mg | ORAL_TABLET | Freq: Every day | ORAL | 3 refills | Status: DC
  Filled 2021-01-30: qty 90, 90d supply, fill #0

## 2021-01-30 MED ORDER — ASPIRIN 81 MG PO CHEW
81.0000 mg | CHEWABLE_TABLET | Freq: Every day | ORAL | 3 refills | Status: DC
  Filled 2021-01-30: qty 90, 90d supply, fill #0

## 2021-01-30 NOTE — Progress Notes (Signed)
Sent all RX, (except levothyroxine, NTG and hydrocodone) to [facility].

## 2021-02-01 ENCOUNTER — Ambulatory Visit (INDEPENDENT_AMBULATORY_CARE_PROVIDER_SITE_OTHER): Payer: Self-pay

## 2021-02-01 ENCOUNTER — Other Ambulatory Visit: Payer: Self-pay

## 2021-02-01 DIAGNOSIS — I5022 Chronic systolic (congestive) heart failure: Secondary | ICD-10-CM

## 2021-02-01 DIAGNOSIS — I2542 Coronary artery dissection: Secondary | ICD-10-CM

## 2021-02-01 LAB — ECHOCARDIOGRAM LIMITED: S' Lateral: 3.1 cm

## 2021-02-01 MED ORDER — PERFLUTREN LIPID MICROSPHERE
1.0000 mL | INTRAVENOUS | Status: AC | PRN
Start: 2021-02-01 — End: 2021-02-01
  Administered 2021-02-01: 2 mL via INTRAVENOUS

## 2021-02-14 ENCOUNTER — Ambulatory Visit: Payer: Medicaid Other | Admitting: Internal Medicine

## 2021-02-14 NOTE — Progress Notes (Deleted)
   Follow-up Outpatient Visit Date: 02/14/2021  Primary Care Provider: [facility] [address] [place] [address]  Chief Complaint: ***  HPI:  Ms. Gabrielle Riley is a 55 y.o. 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  I last saw her in early September following her hospitalization.  At that time, she was feeling quite well.  Her only complaint was of urinary frequency after taking her diuretic.  Follow-up echo earlier this month showed stable mild reduction in LVEF with wall motion abnormality involving the apical region.  Small pericardial effusion was also noted.  Renal artery Doppler was normal without findings to suggest fibromuscular dysplasia.  --------------------------------------------------------------------------------------------------  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   Thyroid disease    Past Surgical History:  Procedure Laterality Date   CARDIAC CATHETERIZATION     ECTOPIC PREGNANCY SURGERY     LEFT HEART CATH AND CORONARY ANGIOGRAPHY N/A 12/11/2020   Procedure: LEFT HEART CATH AND CORONARY ANGIOGRAPHY;  Surgeon: Yvonne Kendall, MD;  Location: [facility] INVASIVE CV LAB;  Service: Cardiovascular;  Laterality: N/A;    No outpatient medications have been marked as taking for the 02/14/21 encounter (Appointment) with Kadee Philyaw, Cristal Deer, MD.    Allergies: Imitrex [sumatriptan] and Sulfa antibiotics  Social History   Tobacco Use   Smoking status: Every Day    Packs/day: 0.50    Years: 25.00    Pack years: 12.50     Types: Cigarettes   Smokeless tobacco: Never  Vaping Use   Vaping Use: Never used  Substance Use Topics   Alcohol use: Not Currently    Comment: 1-2 drinks occassionally   Drug use: Not Currently    Family History  Problem Relation Age of Onset   Diabetes Sister    Heart attack Brother     Review of Systems: A 12-system review of systems was performed and was negative except as noted in the HPI.  --------------------------------------------------------------------------------------------------  Physical Exam: There were no vitals taken for this visit.  General:  NAD. Neck: No JVD or HJR. Lungs: Clear to auscultation bilaterally without wheezes or crackles. Heart: Regular rate and rhythm without murmurs, rubs, or gallops. Abdomen: Soft, nontender, nondistended. Extremities: No lower extremity edema.  EKG:  ***  Lab Results  Component Value Date   WBC 7.2 12/21/2020   HGB 12.4 12/21/2020   HCT 37.7 12/21/2020   MCV 91.3 12/21/2020   PLT 376 12/21/2020    Lab Results  Component Value Date   NA 139 12/21/2020   K 4.1 12/21/2020   CL 103 12/21/2020   CO2 32 12/21/2020   BUN 28 (H) 12/21/2020   CREATININE 0.82 12/21/2020   GLUCOSE 97 12/21/2020    No results found for: CHOL, HDL, LDLCALC, LDLDIRECT, TRIG, CHOLHDL  --------------------------------------------------------------------------------------------------  ASSESSMENT AND PLAN: ***  Yvonne Kendall, MD 02/14/2021 6:36 AM

## 2021-03-08 ENCOUNTER — Ambulatory Visit: Payer: Self-pay | Admitting: Nurse Practitioner

## 2021-03-08 NOTE — Progress Notes (Incomplete)
Office Visit    Patient Name: Gabrielle Riley Date of Encounter: 03/08/2021  Primary Care Provider:  [facility] Primary Cardiologist:  None  Chief Complaint    55 year old female that has past medical history of Inferiolateral STEMI secondary to spontaneous dissection of distal OM branches, tobacco abuse, and hypothyroid presents to clinic for management of her CAD.   Past Medical History    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   Thyroid disease    Past Surgical History:  Procedure Laterality Date   CARDIAC CATHETERIZATION     ECTOPIC PREGNANCY SURGERY     LEFT HEART CATH AND CORONARY ANGIOGRAPHY N/A 12/11/2020   Procedure: LEFT HEART CATH AND CORONARY ANGIOGRAPHY;  Surgeon: Yvonne Kendall, MD;  Location: [facility] INVASIVE CV LAB;  Service: Cardiovascular;  Laterality: N/A;    Allergies  Allergies  Allergen Reactions   Imitrex [Sumatriptan]     Throat closing   Sulfa Antibiotics     History of Present Illness    55 year old female that has past medical history of Inferiolateral STEMI secondary to spontaneous dissection of distal OM branches, tobacco abuse, and hypothyroid. On 12/11/2020 she presented to [facility] with sudden onset of burning and pressure in the center of her chest radiating to the left axilla accompanied by SOB and nausea. EKG showed inferolateral ST elevation. Cardiac catheretization was performed and showed spontaneous dissection of distal OM branches with occlusion of the distal vessel. Vessel was too small for PCI. Echo on 8/24 showed hypercontractile LV basal segments with apical hypokinesis suggesting Takotsubo cardiomyopathy. LV function mildly depressed with EF 45-50% with grade I diastolic dysfunction. Follow-up echo  performed on 10/14 showed similar results to prior echo with EF remaining 45-50%. Renal Ultrasound was performed that showed no evidence of renal artery stenosis or fibromuscular dysplasia.   Home Medications    Current Outpatient Medications  Medication Sig Dispense Refill   aspirin 81 MG chewable tablet Chew 1 tablet (81 mg total) by mouth once daily. 90 tablet 3   atorvastatin (LIPITOR) 20 MG tablet Take 1 tablet (20 mg total) by mouth once daily. 90 tablet 3   carvedilol (COREG) 3.125 MG tablet Take 1 tablet (3.125 mg total) by mouth 2 (two) times daily with a meal. 180 tablet 3   furosemide (LASIX) 20 MG tablet Take 1 tablet (20 mg total) by mouth once daily. 90 tablet 3   HYDROcodone-acetaminophen (NORCO/VICODIN) 5-325 MG tablet Take 1 tablet by mouth every 4 (four) hours as needed for moderate pain. 12 tablet 0   levothyroxine (SYNTHROID) 100 MCG tablet Take 100 mcg by mouth daily before breakfast.     losartan (COZAAR) 25 MG tablet Take (1/2) tablet (12.5 mg total) by mouth once daily. 45 tablet 3   nitroGLYCERIN (NITROSTAT) 0.4 MG SL tablet Place 1 tablet (0.4 mg total) under the tongue every 5 (five) minutes as needed for chest pain. 25 tablet 3   No current facility-administered medications for this visit.     Review of Systems    ***.  All other systems reviewed and are otherwise negative except as noted above. {The patient has an active order for outpatient cardiac rehabilitation.   Please indicate if the patient is ready to start. Do NOT delete this.  It will auto delete.  Refresh note, then sign.              Click here to document readiness and see contraindications.  :1}  Cardiac Rehabilitation Eligibility Assessment      Physical Exam    VS:  There were no vitals taken for this visit. , BMI There is no height or weight on file to calculate BMI.     GEN: Well nourished, well developed, in no acute distress. HEENT: normal. Neck: Supple, no JVD, carotid bruits, or  masses. Cardiac: RRR, no murmurs, rubs, or gallops. No clubbing, cyanosis, edema.  Radials/DP/PT 2+ and equal bilaterally.  Respiratory:  Respirations regular and unlabored, clear to auscultation bilaterally. GI: Soft, nontender, nondistended, BS + x 4. MS: no deformity or atrophy. Skin: warm and dry, no rash. Neuro:  Strength and sensation are intact. Psych: Normal affect.  Accessory Clinical Findings    ECG personally reviewed by me today - *** - no acute changes.  Lab Results  Component Value Date   WBC 7.2 12/21/2020   HGB 12.4 12/21/2020   HCT 37.7 12/21/2020   MCV 91.3 12/21/2020   PLT 376 12/21/2020   Lab Results  Component Value Date   CREATININE 0.82 12/21/2020   BUN 28 (H) 12/21/2020   NA 139 12/21/2020   K 4.1 12/21/2020   CL 103 12/21/2020   CO2 32 12/21/2020   No results found for: ALT, AST, GGT, ALKPHOS, BILITOT No results found for: CHOL, HDL, LDLCALC, LDLDIRECT, TRIG, CHOLHDL  No results found for: HGBA1C  Assessment & Plan    1.  ***   Nicolasa Ducking, NP 03/08/2021, 7:20 AM

## 2021-03-11 ENCOUNTER — Other Ambulatory Visit: Payer: Self-pay | Admitting: Internal Medicine

## 2021-03-19 ENCOUNTER — Other Ambulatory Visit: Payer: Self-pay

## 2021-03-19 ENCOUNTER — Encounter: Payer: Self-pay | Admitting: *Deleted

## 2021-03-19 ENCOUNTER — Encounter: Payer: No Typology Code available for payment source | Attending: Internal Medicine | Admitting: *Deleted

## 2021-03-19 DIAGNOSIS — I213 ST elevation (STEMI) myocardial infarction of unspecified site: Secondary | ICD-10-CM

## 2021-03-19 NOTE — Progress Notes (Signed)
Virtual orientation call completed today. shehas an appointment on Date: 04/02/2021  for EP eval and gym Orientation.  Documentation of diagnosis can be found in [facility] Date: 12/13/2020 Rocky is a current tobacco user. Intervention for tobacco cessation was provided at the initial medical review. She was asked about readiness to quit and reported she wants to quit,does not want to use medications . Patient was advised and educated about tobacco cessation using combination therapy, tobacco cessation classes, quit line, and quit smoking apps. Patient demonstrated understanding of this material. Staff will continue to provide encouragement and follow up with the patient throughout the program.  .

## 2021-04-02 ENCOUNTER — Encounter: Payer: Medicaid Other | Attending: Internal Medicine

## 2021-04-14 ENCOUNTER — Other Ambulatory Visit: Payer: Self-pay | Admitting: Internal Medicine

## 2021-04-26 ENCOUNTER — Other Ambulatory Visit: Payer: Self-pay | Admitting: Internal Medicine

## 2021-05-01 ENCOUNTER — Other Ambulatory Visit: Payer: Self-pay

## 2021-05-01 ENCOUNTER — Encounter: Payer: Self-pay | Admitting: Family

## 2021-05-01 ENCOUNTER — Ambulatory Visit: Payer: Medicaid Other | Attending: Family | Admitting: Family

## 2021-05-01 VITALS — BP 135/80 | HR 86 | Resp 20 | Ht 60.0 in | Wt 144.2 lb

## 2021-05-01 DIAGNOSIS — F439 Reaction to severe stress, unspecified: Secondary | ICD-10-CM | POA: Insufficient documentation

## 2021-05-01 DIAGNOSIS — I252 Old myocardial infarction: Secondary | ICD-10-CM | POA: Insufficient documentation

## 2021-05-01 DIAGNOSIS — I2542 Coronary artery dissection: Secondary | ICD-10-CM | POA: Insufficient documentation

## 2021-05-01 DIAGNOSIS — Z720 Tobacco use: Secondary | ICD-10-CM

## 2021-05-01 DIAGNOSIS — I5181 Takotsubo syndrome: Secondary | ICD-10-CM | POA: Insufficient documentation

## 2021-05-01 DIAGNOSIS — I2121 ST elevation (STEMI) myocardial infarction involving left circumflex coronary artery: Secondary | ICD-10-CM

## 2021-05-01 DIAGNOSIS — I5022 Chronic systolic (congestive) heart failure: Secondary | ICD-10-CM

## 2021-05-01 DIAGNOSIS — E079 Disorder of thyroid, unspecified: Secondary | ICD-10-CM | POA: Insufficient documentation

## 2021-05-01 DIAGNOSIS — I2510 Atherosclerotic heart disease of native coronary artery without angina pectoris: Secondary | ICD-10-CM | POA: Insufficient documentation

## 2021-05-01 DIAGNOSIS — F17210 Nicotine dependence, cigarettes, uncomplicated: Secondary | ICD-10-CM | POA: Insufficient documentation

## 2021-05-01 DIAGNOSIS — Z79899 Other long term (current) drug therapy: Secondary | ICD-10-CM | POA: Insufficient documentation

## 2021-05-01 MED ORDER — SACUBITRIL-VALSARTAN 24-26 MG PO TABS: 1.0000 | ORAL_TABLET | Freq: Two times a day (BID) | ORAL | 3 refills | Status: DC
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
## 2021-05-01 NOTE — Patient Instructions (Addendum)
Continue weighing daily and call for an overnight weight gain of 3 pounds or more or a weekly weight gain of more than 5 pounds.     Begin entresto tomorrow as 1 tablet in the morning and 1 tablet in the evening. You will not take anymore losartan.    Stop at [facility] and pick up packet

## 2021-05-15 ENCOUNTER — Other Ambulatory Visit: Payer: Self-pay

## 2021-05-15 DIAGNOSIS — I213 ST elevation (STEMI) myocardial infarction of unspecified site: Secondary | ICD-10-CM

## 2021-05-15 NOTE — Progress Notes (Signed)
Patient arrived to [facility] for orientation and stated that did she not have insurance. Staff offered patient to stay for appointment with self-pay, however, patient declined. Staff provided patient with [facility]'s financial assistance number and patient stated she was also going to talk to her doctor about other financial options to pursue. Willing to reschedule patient  whenever she is financially ready.

## 2021-05-24 ENCOUNTER — Telehealth: Payer: Self-pay | Admitting: Pharmacist

## 2021-05-24 NOTE — Telephone Encounter (Signed)
Patient failed to provide requested 2023 financial documentation. No additional medication assistance will be provided by [facility] without the required proof of income documentation. Patient notified by letter. ? ?Gabrielle Riley ?[facility] ?

## 2021-05-28 ENCOUNTER — Other Ambulatory Visit: Payer: Self-pay | Admitting: Family

## 2021-05-28 ENCOUNTER — Telehealth: Payer: Self-pay | Admitting: Family

## 2021-05-28 ENCOUNTER — Telehealth: Payer: Self-pay | Admitting: Pharmacy Technician

## 2021-05-28 MED ORDER — CARVEDILOL 3.125 MG PO TABS: 3.1250 mg | ORAL_TABLET | Freq: Two times a day (BID) | ORAL | 3 refills | Status: DC

## 2021-05-28 MED ORDER — ATORVASTATIN CALCIUM 20 MG PO TABS: 20.0000 mg | ORAL_TABLET | Freq: Every day | ORAL | 3 refills | Status: DC

## 2021-05-28 MED ORDER — FUROSEMIDE 20 MG PO TABS: 20.0000 mg | ORAL_TABLET | Freq: Every day | ORAL | 3 refills | Status: DC

## 2021-05-28 MED ORDER — ASPIRIN 81 MG PO CHEW: 81.0000 mg | CHEWABLE_TABLET | Freq: Every day | ORAL | 3 refills | Status: AC

## 2021-05-28 MED ORDER — SACUBITRIL-VALSARTAN 24-26 MG PO TABS: 1.0000 | ORAL_TABLET | Freq: Two times a day (BID) | ORAL | 3 refills | Status: DC

## 2021-05-28 NOTE — Telephone Encounter (Signed)
°  Gabrielle Riley [place], [place]  [address]  May 24, 2021    Dear Gabrielle Riley:  This is to inform you that you are no longer eligible to receive medication assistance at [facility].  The reason(s) are:    _____Your total gross monthly household income exceeds 300% of the Federal Poverty Level.   _____Tangible assets (savings, checking, stocks/bonds, pension, retirement, etc.) exceeds our limit  _____You are eligible to receive benefits from [facility] [place] [facility], [facility] or HIV Medication              Assistance Program _____You are eligible to receive benefits from a Medicare Part D plan _____You have prescription insurance  _____You are not an [facility] resident __X__Failure to provide all requested documentation (proof of income for 2023, and/or Patient Intake Application, DOH Attestation, Contract, etc).    Medication assistance will resume once all requested documentation has been returned to our clinic.  If you have questions, please contact our clinic at [phone].    Thank you,  [facility]

## 2021-05-28 NOTE — Progress Notes (Signed)
Refills sent to [facility] on [address] per her request

## 2021-05-28 NOTE — Telephone Encounter (Signed)
Called and reminded patient that we need proof of income for her application for [employer] for patient assistance for [employer].   Mandeep Kiser, NT

## 2021-05-29 ENCOUNTER — Other Ambulatory Visit: Payer: Self-pay

## 2021-06-04 ENCOUNTER — Telehealth: Payer: Self-pay | Admitting: Family

## 2021-06-04 ENCOUNTER — Ambulatory Visit: Payer: Medicaid Other | Admitting: Family

## 2021-06-04 NOTE — Telephone Encounter (Signed)
Patient did not show for her [facility] appointment on 06/04/21. Will attempt to reschedule.   °

## 2021-06-21 ENCOUNTER — Encounter: Payer: Self-pay | Admitting: Internal Medicine

## 2021-07-04 ENCOUNTER — Encounter: Payer: Self-pay | Admitting: Internal Medicine

## 2021-07-04 ENCOUNTER — Encounter: Payer: Self-pay | Admitting: Family

## 2021-07-04 ENCOUNTER — Telehealth: Payer: Self-pay | Admitting: Family

## 2021-07-04 NOTE — Telephone Encounter (Signed)
Spoke to patient who requested a letter for service dog and had her make an appointment with [place] first before we can do the letter. Patient also stated she is no longer working and does not have proof of income for novart[employer] patient assistance for entresto so I told patient I will go ahead and send in her application and she needs to call company and state she has no income so they can do what they need on her end to get her approved potientally for entresto. ? ? ?Gabrielle Riley,NT ?

## 2021-07-05 ENCOUNTER — Ambulatory Visit (INDEPENDENT_AMBULATORY_CARE_PROVIDER_SITE_OTHER): Payer: Self-pay | Admitting: Nurse Practitioner

## 2021-07-05 ENCOUNTER — Other Ambulatory Visit: Payer: Self-pay

## 2021-07-05 ENCOUNTER — Telehealth: Payer: Self-pay | Admitting: Nurse Practitioner

## 2021-07-05 ENCOUNTER — Encounter: Payer: Self-pay | Admitting: Nurse Practitioner

## 2021-07-05 VITALS — BP 128/88 | HR 68 | Ht 60.0 in | Wt 145.0 lb

## 2021-07-05 DIAGNOSIS — I5181 Takotsubo syndrome: Secondary | ICD-10-CM | POA: Insufficient documentation

## 2021-07-05 DIAGNOSIS — I2542 Coronary artery dissection: Secondary | ICD-10-CM

## 2021-07-05 DIAGNOSIS — I5022 Chronic systolic (congestive) heart failure: Secondary | ICD-10-CM

## 2021-07-05 DIAGNOSIS — I2119 ST elevation (STEMI) myocardial infarction involving other coronary artery of inferior wall: Secondary | ICD-10-CM | POA: Insufficient documentation

## 2021-07-05 DIAGNOSIS — E785 Hyperlipidemia, unspecified: Secondary | ICD-10-CM

## 2021-07-05 DIAGNOSIS — I2510 Atherosclerotic heart disease of native coronary artery without angina pectoris: Secondary | ICD-10-CM

## 2021-07-05 DIAGNOSIS — I255 Ischemic cardiomyopathy: Secondary | ICD-10-CM

## 2021-07-05 DIAGNOSIS — E039 Hypothyroidism, unspecified: Secondary | ICD-10-CM | POA: Insufficient documentation

## 2021-07-05 DIAGNOSIS — Z720 Tobacco use: Secondary | ICD-10-CM

## 2021-07-05 MED ORDER — SACUBITRIL-VALSARTAN 24-26 MG PO TABS: 1.0000 | ORAL_TABLET | Freq: Two times a day (BID) | ORAL | 3 refills | Status: DC

## 2021-07-05 NOTE — Patient Instructions (Signed)
Medication Instructions:  ?No changes at this time. ? ?*If you need a refill on your cardiac medications before your next appointment, please call your pharmacy* ? ? ?Lab Work: ?None ? ?If you have labs (blood work) drawn today and your tests are completely normal, you will receive your results only by: ?MyChart Message (if you have MyChart) OR ?A paper copy in the mail ?If you have any lab test that is abnormal or we need to change your treatment, we will call you to review the results. ? ? ?Testing/Procedures: ?None ? ? ?Follow-Up: ?At [facility], you and your health needs are our priority.  As part of our continuing mission to provide you with exceptional heart care, we have created designated Provider Care Teams.  These Care Teams include your primary Cardiologist (physician) and Advanced Practice Providers (APPs -  Physician Assistants and Nurse Practitioners) who all work together to provide you with the care you need, when you need it. ? ? ?Your next appointment:   ?3 month(s) Darylene Price at the [facility] ? ?6 month(s) Dr. Saunders Revel here in our office. ? ?The format for your next appointment:   ?In Person ?1}  ? ? ?Other Instructions ? ?Coupons and prices are through [employer] ? ?Carvedilol 3.125 mg twice a day  ?[facility] $10.00 90-day supply. No coupon needed. ? ?Atorvastatin 20 mg once a day  ?[facility] $13.76 90-day supply with coupon ? ?Furosemide 20 mg once a day ?[facility] $5.50 for 90-day supply with coupon ? ?Levothyroxine 100 mcg once a day ?[facility] for $10.00 for 90-day supply. No coupon needed. ? ?Nitroglycerine 0.5 mg as needed ?[facility] for $6.42 with coupon  ?

## 2021-07-05 NOTE — Progress Notes (Signed)
? ? ?Office Visit  ?  ?Patient Name: Gabrielle Riley ?Date of Encounter: 07/05/2021 ? ?Primary Care Provider:  Jerrilyn Riley, [facility] ?Primary Cardiologist:  Gabrielle Kendallhristopher End, MD ? ?Chief Complaint  ?  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 ? ?Past Medical History  ?  ?Past Medical History:  ?Diagnosis Date  ? Acute ST elevation myocardial infarction (STEMI) of inferolateral wall (HCC)   ? a. 11/2020 STEMI 2/2 SCAD involving OM2-->Med rx.  ? CAD (coronary artery disease)   ? a. 11/2020 STEMI/Cath: LM nl, LAD 1955m, LCX nl, OM2 75/100 (SCAD), RCA 15p-->Med Rx.  ? FHx: rheumatic fever   ? Hyperlipidemia LDL goal <70   ? Hypothyroidism   ? Spontaneous dissection of coronary artery   ? a. 11/2020 ->OM2 75/100-->Med rx; b. 01/2021 Renal Duplex: No RAS or evidence of FMD. Nl Celiac/SMA/IMA.  ? Stress-induced cardiomyopathy   ? a. 11/2020 Echo: EF 45-50%, apical HK w/ hypercontractile LV basal segments sugg of stress-induced CM. Gr1 DD, nl RV fxn; b. 01/2021 Echo: EF 45-50%, sev mid-dist peri-apical/apical HK. Basal wall well preserved. Nl RV fxn.  ? Tobacco abuse   ? ?Past Surgical History:  ?Procedure Laterality Date  ? CARDIAC CATHETERIZATION    ? ECTOPIC PREGNANCY SURGERY    ? LEFT HEART CATH AND CORONARY ANGIOGRAPHY N/A 12/11/2020  ? Procedure: LEFT HEART CATH AND CORONARY ANGIOGRAPHY;  Surgeon: Gabrielle KendallEnd, Gabrielle Elsberry, MD;  Location: [facility] INVASIVE CV LAB;  Service: Cardiovascular;  Laterality: N/A;  ? ? ?Allergies ? ?Allergies  ?Allergen Reactions  ? Imitrex [Sumatriptan]   ?  Throat closing  ? Sulfa Antibiotics   ? ? ?History of Present Illness  ?  ?56 year old female with above past medical history including spontaneous coronary artery dissection, CAD, stress-induced  cardiomyopathy, hyperlipidemia, hypothyroidism, and tobacco abuse.  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  Vessel was too small for PCI and she was medically managed.  Echocardiogram showed an EF of 45 to 50% with apical hypokinesis and otherwise hypercontractile LV basal segment suggestive of stress-induced cardiomyopathy.  She was placed on guideline directed medical therapy.  She was last seen in general cardiology clinic in September 2022, at which time she was feeling well.  Follow-up renal arterial duplex was negative for renal artery stenosis or evidence of fibromuscular dysplasia.  Follow-up echo continue to show an EF of 45 to 50% with severe mid to distal periapical/apical hypokinesis, while the basal wall remains well-preserved. ? ?Gabrielle Riley has since been followed closely in the heart failure clinic.  Losartan was transitioned to Gabrielle Riley at January 11 visit, at which time she was feeling well.  She has tolerated Entresto well but has had to stop working and is concerned about affordability.  I have filled out the [place] assistance [place] paperwork for her today.  She has had significant frustration with the Gabrielle Riley billing department as she has been sending monthly payments but was recently sent to collections.  Further, she has had frustration with her landlord as the apartment supervisors have been giving her a hard time about her emotional support dog, and she has requested a letter from Gabrielle Riley to state its necessity.  She quit smoking  cigarettes but is vaping daily.  She notes that she is using the lowest dose nicotine available.  Fortunately, she has not been having any chest pain or dyspnea.  She denies palpitations, PND, orthopnea, dizziness, syncope, edema, or early satiety. ? ?Home Medications  ?  ?Current Outpatient Medications  ?Medication Sig  Dispense Refill  ? aspirin 81 MG chewable tablet Chew 1 tablet (81 mg total) by mouth once daily. 90 tablet 3  ? atorvastatin (LIPITOR) 20 MG tablet Take 1 tablet (20 mg total) by mouth once daily. 90 tablet 3  ? carvedilol (COREG) 3.125 MG tablet Take 1 tablet (3.125 mg total) by mouth 2 (two) times daily with a meal. 180 tablet 3  ? furosemide (LASIX) 20 MG tablet Take 1 tablet (20 mg total) by mouth once daily. 90 tablet 3  ? levothyroxine (SYNTHROID) 100 MCG tablet Take 100 mcg by mouth daily before breakfast.    ? nitroGLYCERIN (NITROSTAT) 0.4 MG SL tablet Place 1 tablet (0.4 mg total) under the tongue every 5 (five) minutes as needed for chest pain. 25 tablet 3  ? sacubitril-valsartan (ENTRESTO) 24-26 MG Take 1 tablet by mouth 2 (two) times daily. 180 tablet 3  ? ?No current facility-administered medications for this visit.  ?  ? ?Review of Systems  ?  ?She denies chest pain, palpitations, dyspnea, pnd, orthopnea, n, v, dizziness, syncope, edema, weight gain, or early satiety.  All other systems reviewed and are otherwise negative except as noted above. ?  ? ?Physical Exam  ?  ?VS:  BP 128/88 (BP Location: Left Arm, Patient Position: Sitting, Cuff Size: Normal)   Pulse 68   Ht 5' (1.524 m)   Wt 145 lb (65.8 kg)   BMI 28.32 kg/m?  , BMI Body mass index is 28.32 kg/m?. ?    ?GEN: Well nourished, well developed, in no acute distress. ?HEENT: normal. ?Neck: Supple, no JVD, carotid bruits, or masses. ?Cardiac: RRR, no murmurs, rubs, or gallops. No clubbing, cyanosis, edema.  Radials/PT 2+ and equal bilaterally.  ?Respiratory:  Respirations regular and unlabored, clear to auscultation bilaterally. ?GI: Soft, nontender, nondistended, BS + x 4. ?MS: no deformity or atrophy. ?Skin: warm and dry, no rash. ?Neuro:  Strength and sensation are intact. ?Psych: Normal affect. ? ?Accessory Clinical Findings  ?  ?ECG personally reviewed by me today -regular sinus rhythm, 68, inferior and anterolateral T wave inversion- no  acute changes. ? ?Lab Results  ?Component Value Date  ? WBC 7.2 12/21/2020  ? HGB 12.4 12/21/2020  ? HCT 37.7 12/21/2020  ? MCV 91.3 12/21/2020  ? PLT 376 12/21/2020  ? ?Lab Results  ?Component Value Date  ? CREATININE 0.82 12/21/2020  ? BUN 28 (H) 12/21/2020  ? NA 139 12/21/2020  ? K 4.1 12/21/2020  ? CL 103 12/21/2020  ? CO2 32 12/21/2020  ? ? ?Assessment & Plan  ?  ?1.  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  She has been managed medically.  She has not been experiencing any chest pain and remains on aspirin, statin, and beta-blocker therapy.  Renal arterial duplex was negative for stenosis or evidence of fibromuscular dysplasia.  She was previously referred for cardiac rehab but was not able to participate due to work schedule at that time and subsequently lack of ability to pay for it (  high deductible insurance plan). ? ?2.  Ischemic cardiomyopathy/chronic HFrEF: EF 45 to 50% by echo in August 2022 with stable EF of 45 to 50% by echo in October 2022.  She is euvolemic on exam and has not been having issues with volume excess at home.  She remains on beta-blocker and Entresto therapy.  I have filled out paperwork for [facility] assistance.  We will arrange for follow-up basic metabolic panel, though patient is concerned about cost.  Pending labs, could consider initiation of spironolactone. ? ?3.  Hyperlipidemia: She remains on atorvastatin therapy.  She has not had labs since 2021, at which time LDL was 174.  We will arrange for follow-up fasting labs. ? ?4.  Nicotine use: Patient is no longer smoking cigarettes but is vaping.  We discussed that the long-term clinical data on vaping is not currently well understood.  Complete cessation advised. ? ?5.  Anxiety: Patient is accustomed to having  an emotional support dog.  It was the death of her previous dog that she attributes to her spontaneous coronary artery dissection and stress-induced cardiomyopathy.  I have written a letter for her today and su

## 2021-07-05 NOTE — Telephone Encounter (Signed)
? ? ? ?  Nicolasa Ducking, NP ?[facility] ?[address] ?[place], [place] [address] ?[phone] ? ? ? ?To whom it may concern, ? ?Gabrielle Riley is followed closely in our [facility] in relation to her prior history of myocardial infarction and stress-induced cardiomyopathy in the setting of spontaneous coronary artery dissection.  In an effort to help Ms. Ebbs maintain optimal physical and mental well-being, we feel strongly that she would benefit from ongoing use of her therapy dog. ? ?Please contact us with any questions. ? ?Sincerely, ? ? ? ?Nicolasa Ducking ?

## 2021-07-16 ENCOUNTER — Ambulatory Visit: Payer: Medicaid Other | Admitting: Family

## 2021-07-31 NOTE — Telephone Encounter (Signed)
Medication Samples have been provided to the patient. ? ?Drug name: Sherryll Burger        ?Strength: 24/26 mg         ?Qty: 1 bottle   ?LOT: [id number]   ?Exp.Date: 07/2023 ? ?Dosing instructions: 1 table twice daily ? ?  ? ?[facility] ?7:55 AM ?07/31/2021  ? ?

## 2021-08-16 ENCOUNTER — Other Ambulatory Visit: Payer: Self-pay | Admitting: *Deleted

## 2021-08-16 ENCOUNTER — Other Ambulatory Visit: Payer: Self-pay | Admitting: Family

## 2021-08-16 MED ORDER — SACUBITRIL-VALSARTAN 24-26 MG PO TABS: 1.0000 | ORAL_TABLET | Freq: Two times a day (BID) | ORAL | 3 refills | Status: DC

## 2021-08-16 NOTE — Progress Notes (Signed)
Opened in error

## 2021-08-22 ENCOUNTER — Encounter: Payer: Self-pay | Admitting: Internal Medicine

## 2021-08-23 ENCOUNTER — Telehealth: Payer: Self-pay | Admitting: Family

## 2021-08-23 NOTE — Telephone Encounter (Signed)
Notified patient she was approved for [employer] patient assistance for Entresto until 08/23/2022 and instructions on how to call to set up mail order delivery. ? ? ?Christophe Rising, NT ?

## 2021-09-04 ENCOUNTER — Encounter: Payer: Self-pay | Admitting: Family

## 2021-09-04 ENCOUNTER — Other Ambulatory Visit: Payer: Self-pay | Admitting: Family

## 2021-09-04 ENCOUNTER — Other Ambulatory Visit: Payer: Self-pay | Admitting: Internal Medicine

## 2021-09-04 MED ORDER — ATORVASTATIN CALCIUM 20 MG PO TABS: 20.0000 mg | ORAL_TABLET | Freq: Every day | ORAL | 3 refills | Status: DC

## 2021-09-04 MED ORDER — CARVEDILOL 3.125 MG PO TABS: 3.1250 mg | ORAL_TABLET | Freq: Two times a day (BID) | ORAL | 3 refills | Status: DC

## 2021-09-04 NOTE — Progress Notes (Signed)
Refilled atorvastatin and carvedilol ?

## 2021-10-14 ENCOUNTER — Ambulatory Visit: Payer: Medicaid Other | Admitting: Family

## 2021-10-14 NOTE — Progress Notes (Deleted)
Patient ID: Gabrielle Riley, female    DOB: 09/13/1965, 56 y.o.   MRN: [id number]  HPI  [name] is a 56 y/o female with a history of CAD, thyroid disease, current tobacco use and Takotsubo heart failure.   Echo report from 02/01/21 reviewed and showed an EF of 45-50%. 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  Vessel is too small/distal for intervention. Mild, nonobstructive coronary artery disease involving the LAD and nondominant RCA. Moderately reduced left ventricular systolic function with apical akinesis and otherwise preserved left ventricular ejection fraction.  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 Moderately-severely elevated left ventricular filling pressure consistent with acute heart failure.  Has not been admitted or been in the ED in the last 6 months.   She presents today for a follow-up visit with a chief complaint of   Past Medical History:  Diagnosis Date   Acute ST elevation myocardial infarction (STEMI) of inferolateral wall (HCC)    a. 11/2020 STEMI 2/2 SCAD involving OM2-->Med rx.   CAD (coronary artery disease)    a. 11/2020 STEMI/Cath: LM nl, LAD 35m, LCX nl, OM2 75/100 (SCAD), RCA 15p-->Med Rx.   FHx: rheumatic fever    Hyperlipidemia LDL goal <70    Hypothyroidism    Spontaneous dissection of coronary artery    a. 11/2020 ->OM2 75/100-->Med rx; b. 01/2021 Renal Duplex: No RAS or evidence of FMD. Nl Celiac/SMA/IMA.   Stress-induced cardiomyopathy    a. 11/2020 Echo: EF 45-50%, apical HK w/ hypercontractile LV basal segments sugg of stress-induced CM. Gr1 DD, nl RV fxn; b. 01/2021 Echo: EF 45-50%, sev mid-dist peri-apical/apical HK. Basal wall well preserved. Nl RV fxn.   Tobacco abuse    Past  Surgical History:  Procedure Laterality Date   CARDIAC CATHETERIZATION     ECTOPIC PREGNANCY SURGERY     LEFT HEART CATH AND CORONARY ANGIOGRAPHY N/A 12/11/2020   Procedure: LEFT HEART CATH AND CORONARY ANGIOGRAPHY;  Surgeon: Yvonne Kendall, MD;  Location: [facility] INVASIVE CV LAB;  Service: Cardiovascular;  Laterality: N/A;   Family History  Problem Relation Age of Onset   Diabetes Sister    Heart attack Brother    Social History   Tobacco Use   Smoking status: Every Day    Packs/day: 0.50    Years: 25.00    Total pack years: 12.50    Types: Cigarettes   Smokeless tobacco: Never   Tobacco comments:    Ready to Quit, has not set a quit date yet.   Substance Use Topics   Alcohol use: Not Currently    Comment: 1-2 drinks occassionally   Allergies  Allergen Reactions   Imitrex [Sumatriptan]     Throat closing   Sulfa Antibiotics     Review of Systems  Constitutional:  Positive for fatigue. Negative for appetite change.  HENT:  Negative for congestion, postnasal drip and sore throat.   Eyes: Negative.   Respiratory:  Negative for chest tightness and shortness of breath.   Cardiovascular:  Negative for chest pain, palpitations and leg swelling.  Gastrointestinal:  Negative for abdominal distention.  Endocrine: Negative.   Genitourinary: Negative.   Musculoskeletal:  Positive for arthralgias (left hand). Negative for back pain and neck pain.  Skin: Negative.   Allergic/Immunologic: Negative.   Neurological:  Negative for dizziness and light-headedness.  Hematological:  Negative for adenopathy. Does not bruise/bleed easily.  Psychiatric/Behavioral:  Negative for dysphoric mood and sleep disturbance (sleeping on 1 pillow). The patient is not nervous/anxious.       Physical Exam Vitals and nursing note reviewed.  Constitutional:      Appearance: Normal appearance.  HENT:     Head: Normocephalic and atraumatic.  Cardiovascular:     Rate and Rhythm: Normal rate and  regular rhythm.  Pulmonary:     Effort: Pulmonary effort is normal. No respiratory distress.     Breath sounds: No wheezing or rales.  Abdominal:     General: Abdomen is flat. There is no distension.     Palpations: Abdomen is soft.  Musculoskeletal:        General: No tenderness.     Cervical back: Normal range of motion and neck supple.     Right lower leg: No edema.     Left lower leg: No edema.  Skin:    General: Skin is warm and dry.  Neurological:     General: No focal deficit present.     Mental Status: She is alert and oriented to person, place, and time.  Psychiatric:        Mood and Affect: Mood normal.        Behavior: Behavior normal.     Assessment & Plan:  1: Chronic heart failure with reduced ejection fraction (Takotsubo)- - NYHA class II - euvolemic today - weighing daily; reminded to call for an overnight weight gain of > 2 pounds or a weekly weight gain of > 5 pounds - weight 144.4 from last visit here 6 months ago - not adding salt to her food and has been reading food labels - currently on GDMT of carvedilol and entresto - consider adding spironolactone/ SGLT2 at future visits  2: History of STEMI due to spontaneous coronary artery dissection- - saw cardiology Brion Aliment) 07/05/21 - BMP 07/29/21 reviewed and showed sodium 138, potassium 4.1, creatinine 0.8 & GFR 86  3: Tobacco- - smoking 1/2 ppd of cigarettes  - has previously stopped smoking for a few years and then resumed; had stopped during recent admission but due to stress, resumed smoking - complete cessation discussed with her - saw PCP @ [facility] on 07/29/21   Patient did not bring her medications nor a list. Each medication was verbally reviewed with the patient and she was encouraged to bring the bottles to every visit to confirm accuracy of list.

## 2021-10-15 ENCOUNTER — Telehealth: Payer: Self-pay | Admitting: Family

## 2021-10-15 ENCOUNTER — Ambulatory Visit: Payer: Medicaid Other | Admitting: Family

## 2022-01-09 ENCOUNTER — Ambulatory Visit: Payer: Self-pay | Attending: Internal Medicine | Admitting: Internal Medicine

## 2022-01-09 NOTE — Progress Notes (Deleted)
Follow-up Outpatient Visit Date: 01/09/2022  Primary Care Provider: [facility] [address] [place] [address]  Chief Complaint: ***  HPI:  Ms. Conkel is a 56 y.o. female with history of inferolateral STEMI in the setting of spontaneous coronary artery dissection of OM 2 in 11/2020, stress-induced cardiomyopathy, hyperlipidemia, hypothyroidism, and tobacco abuse, who presents for follow-up of coronary artery disease and cardiomyopathy.  She was last seen in our office in March by Ignacia Bayley, NP, at which time she was concerned about financial issues related to her medical bills and prescriptions.  She had previously been transition from losartan to [facility] but was worried about its affordability.  She was not having any cardiac symptoms.  No medication changes or additional testing were pursued at that time.  --------------------------------------------------------------------------------------------------  Past Medical History:  Diagnosis Date   Acute ST elevation myocardial infarction (STEMI) of inferolateral wall ([place])    a. 11/2020 STEMI 2/2 SCAD involving OM2-->Med rx.   CAD (coronary artery disease)    a. 11/2020 STEMI/Cath: LM nl, LAD 59m, LCX nl, OM2 75/100 (SCAD), RCA 15p-->Med Rx.   FHx: rheumatic fever    Hyperlipidemia LDL goal <70    Hypothyroidism    Spontaneous dissection of coronary artery    a. 11/2020 ->OM2 75/100-->Med rx; b. 01/2021 Renal Duplex: No RAS or evidence of FMD. Nl Celiac/SMA/IMA.   Stress-induced cardiomyopathy    a. 11/2020 Echo: EF 45-50%, apical HK w/ hypercontractile LV basal segments sugg of stress-induced CM. Gr1 DD, nl RV fxn; b. 01/2021 Echo: EF 45-50%, sev mid-dist peri-apical/apical HK. Basal wall well preserved. Nl RV fxn.   Tobacco abuse    Past Surgical History:  Procedure Laterality Date   CARDIAC CATHETERIZATION     ECTOPIC PREGNANCY SURGERY     LEFT HEART CATH AND CORONARY ANGIOGRAPHY N/A 12/11/2020   Procedure:  LEFT HEART CATH AND CORONARY ANGIOGRAPHY;  Surgeon: Nelva Bush, MD;  Location: [place] [facility];  Service: Cardiovascular;  Laterality: N/A;     Recent CV Pertinent Labs: Lab Results  Component Value Date   K 4.1 12/21/2020   BUN 28 (H) 12/21/2020   CREATININE 0.82 12/21/2020    Past medical and surgical history were reviewed and updated in EPIC.  No outpatient medications have been marked as taking for the 01/09/22 encounter (Appointment) with Anastasija Anfinson, Harrell Gave, MD.    Allergies: Imitrex [sumatriptan] and Sulfa antibiotics  Social History   Tobacco Use   Smoking status: Every Day    Packs/day: 0.50    Years: 25.00    Total pack years: 12.50    Types: Cigarettes   Smokeless tobacco: Never   Tobacco comments:    Ready to Quit, has not set a quit date yet.   Vaping Use   Vaping Use: Never used  Substance Use Topics   Alcohol use: Not Currently    Comment: 1-2 drinks occassionally   Drug use: Not Currently    Family History  Problem Relation Age of Onset   Diabetes Sister    Heart attack Brother     Review of Systems: A 12-system review of systems was performed and was negative except as noted in the HPI.  --------------------------------------------------------------------------------------------------  Physical Exam: There were no vitals taken for this visit.  General:  NAD. Neck: No JVD or HJR. Lungs: Clear to auscultation bilaterally without wheezes or crackles. Heart: Regular rate and rhythm without murmurs, rubs, or gallops. Abdomen: Soft, nontender, nondistended. Extremities: No lower extremity edema.  EKG:  ***  Lab Results  Component Value Date   WBC 7.2 12/21/2020   HGB 12.4 12/21/2020   HCT 37.7 12/21/2020   MCV 91.3 12/21/2020   PLT 376 12/21/2020    Lab Results  Component Value Date   NA 139 12/21/2020   K 4.1 12/21/2020   CL 103 12/21/2020   CO2 32 12/21/2020   BUN 28 (H) 12/21/2020   CREATININE 0.82 12/21/2020   GLUCOSE  97 12/21/2020    No results found for: "CHOL", "HDL", "LDLCALC", "LDLDIRECT", "TRIG", "CHOLHDL"  --------------------------------------------------------------------------------------------------  ASSESSMENT AND PLAN: ***  Yvonne Kendall, MD 01/09/2022 6:51 AM

## 2022-06-08 IMAGING — CR DG WRIST COMPLETE 3+V*L*
4 series · 4 of 4 positions shown · non-contrast
Comparison: Same-day hand radiograph.

CLINICAL DATA: Hand pain after fall.

EXAM:
LEFT WRIST - COMPLETE 3+ VIEW

[wrist pa]
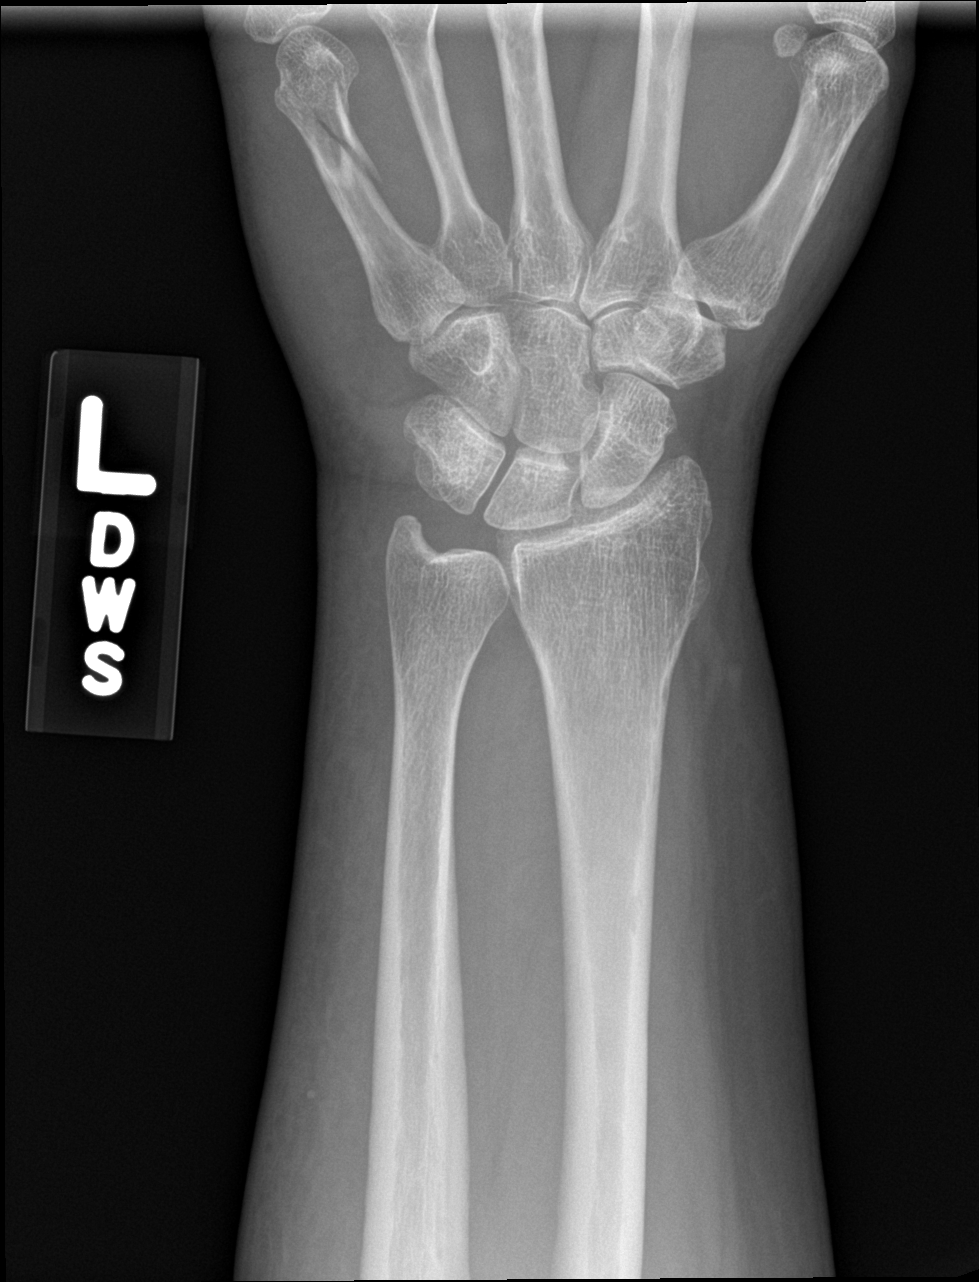

[wrist obl]
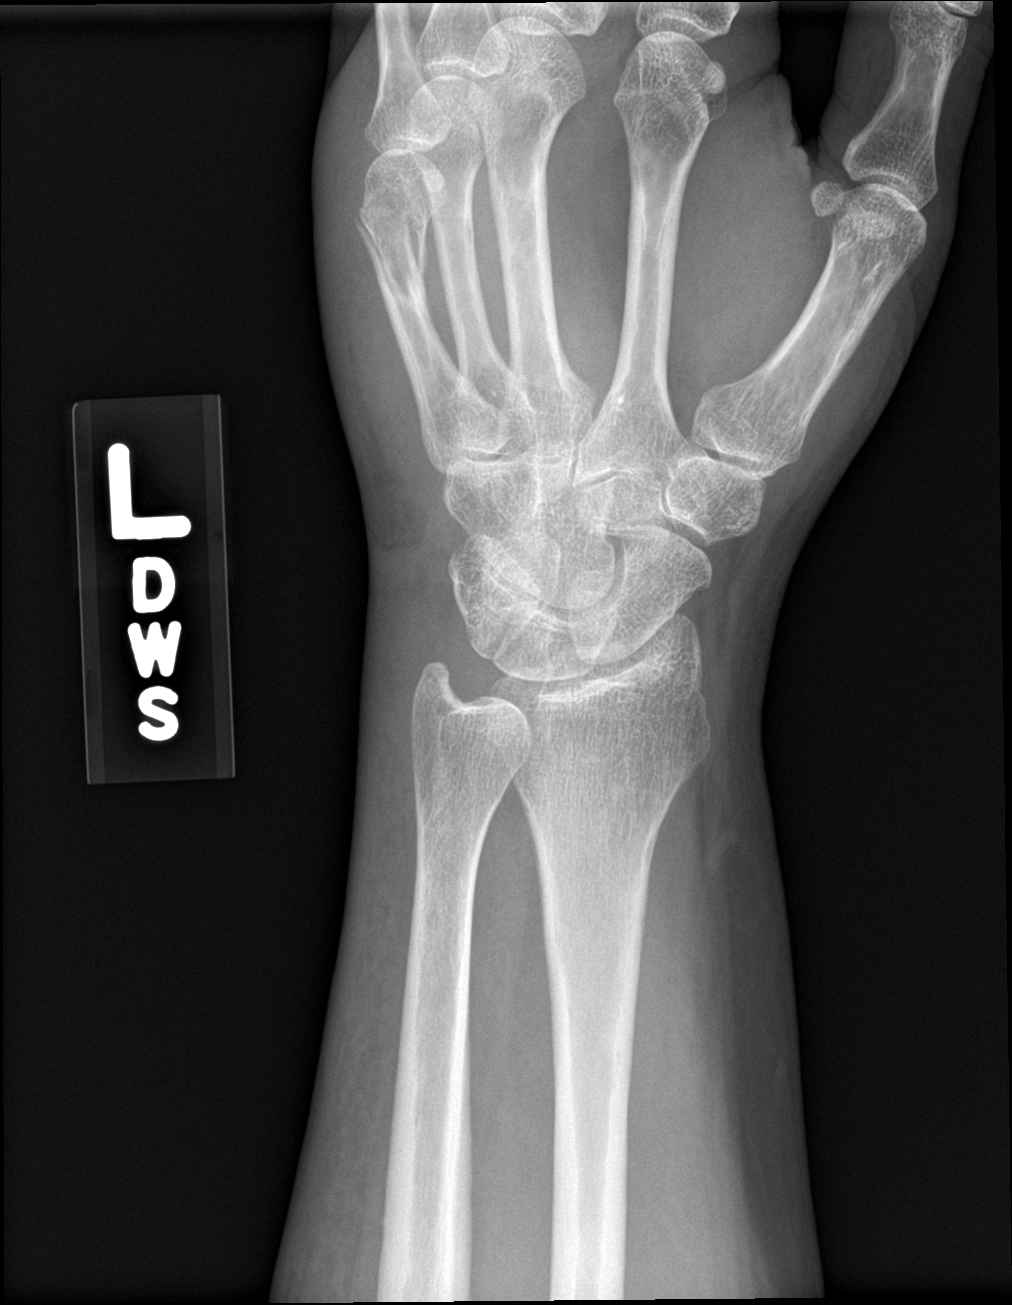

[wrist lat]
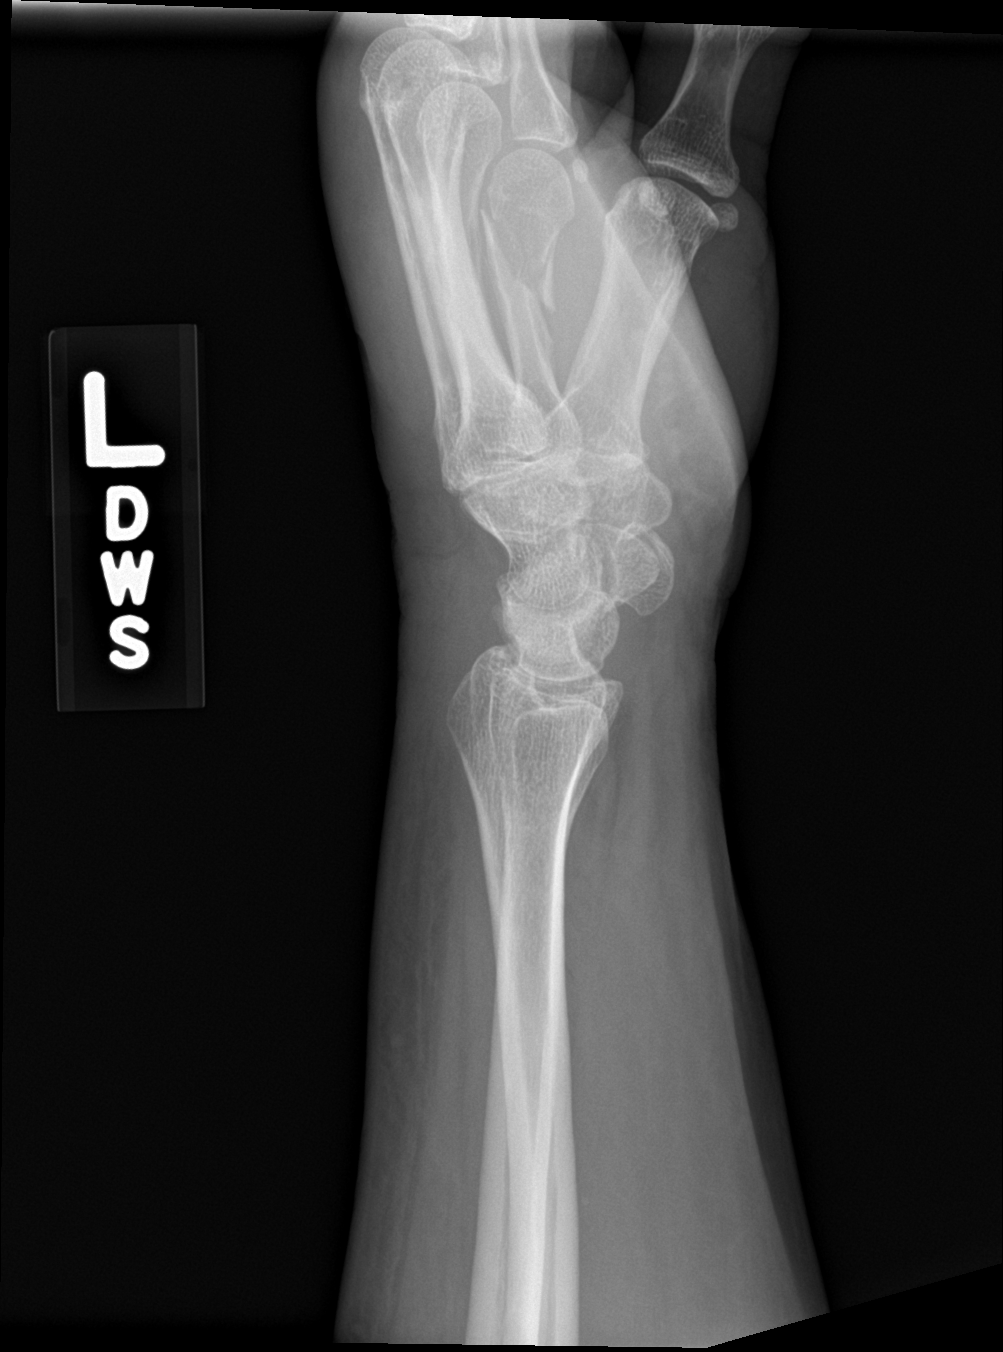

[navicular]
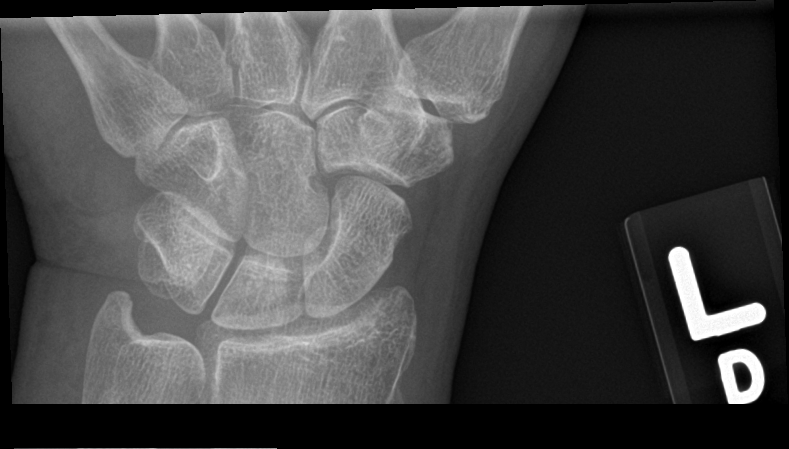

[4 of 4 positions shown; findings below may reference images not displayed]

FINDINGS: Minimally displaced fracture of the distal fifth metacarpal. There
is no evidence of wrist fracture or dislocation.
IMPRESSION: Minimally displaced fracture of the distal fifth metacarpal.

## 2022-06-08 IMAGING — CR DG HAND COMPLETE 3+V*L*
3 series · 3 of 3 positions shown · non-contrast
Comparison: None.

CLINICAL DATA: Hand pain after fall.

EXAM:
LEFT HAND - COMPLETE 3+ VIEW

[hand ap]
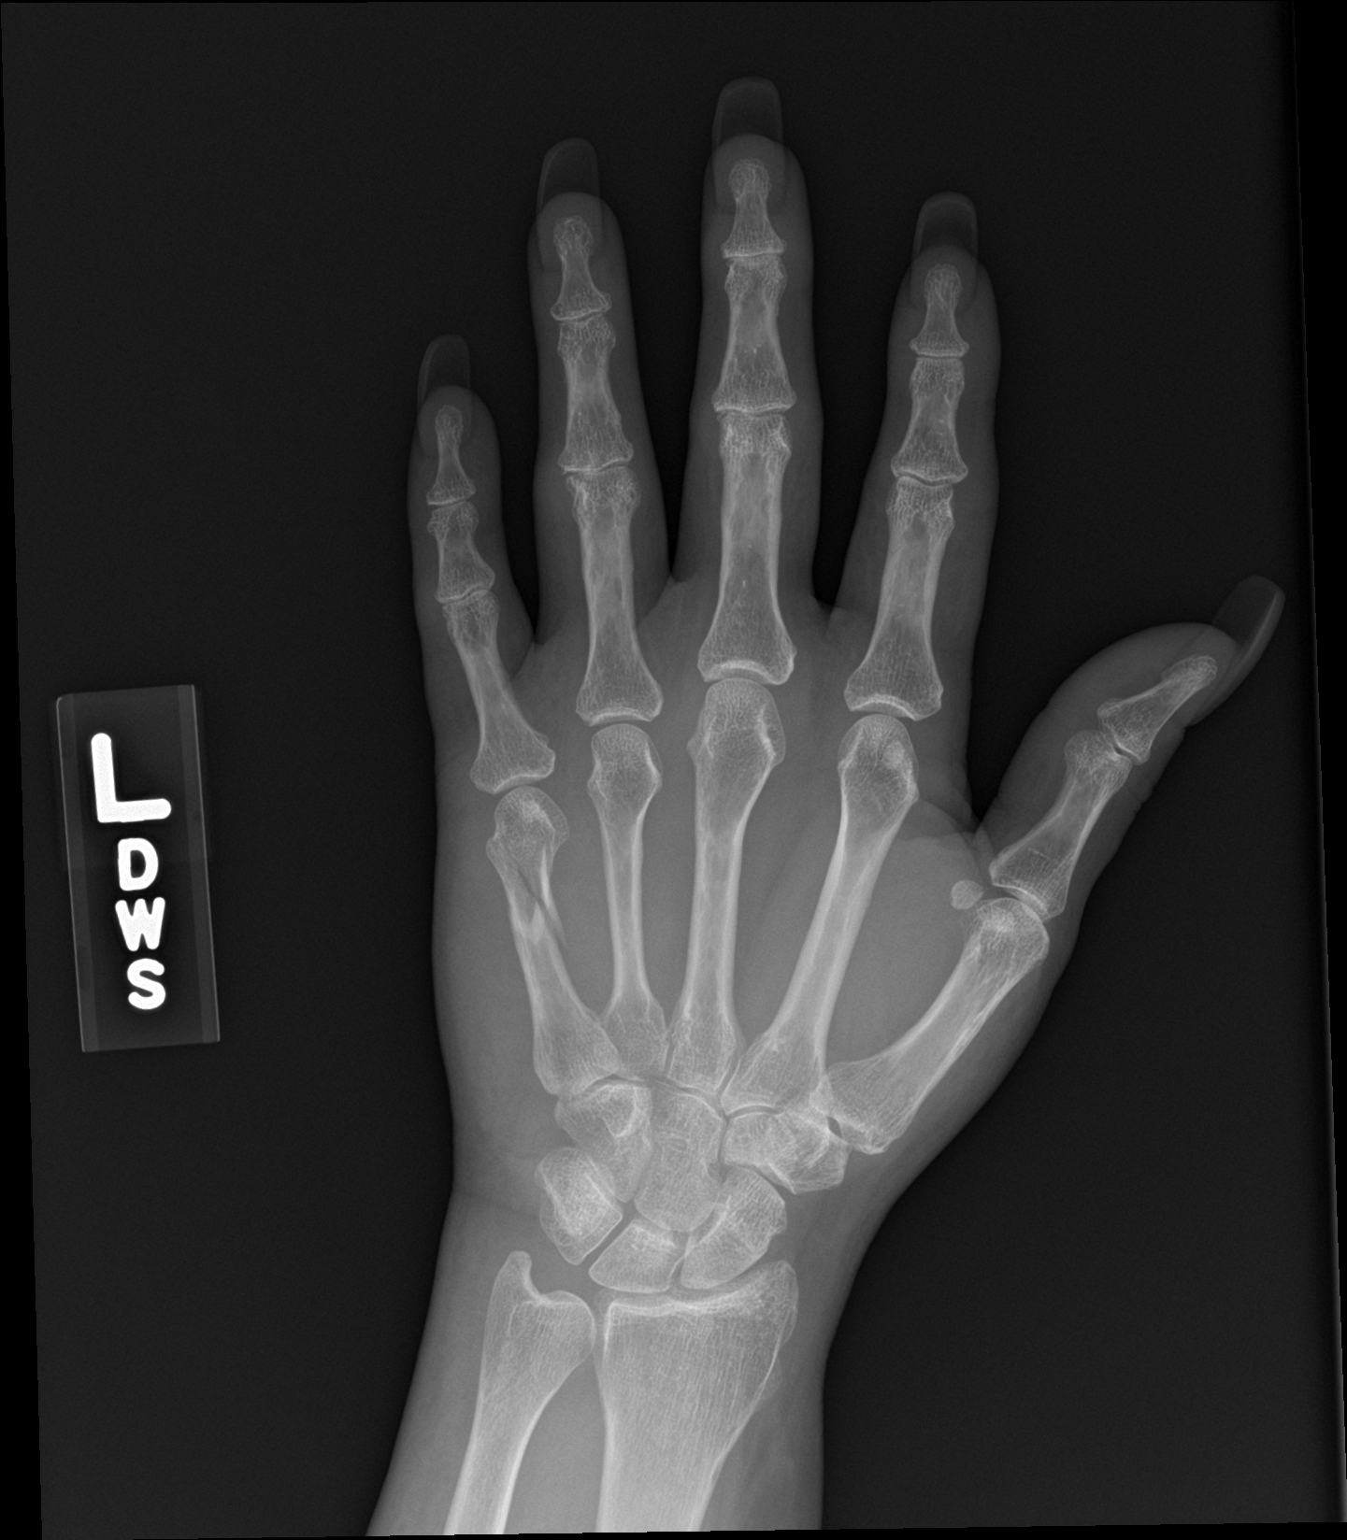

[hand obl]
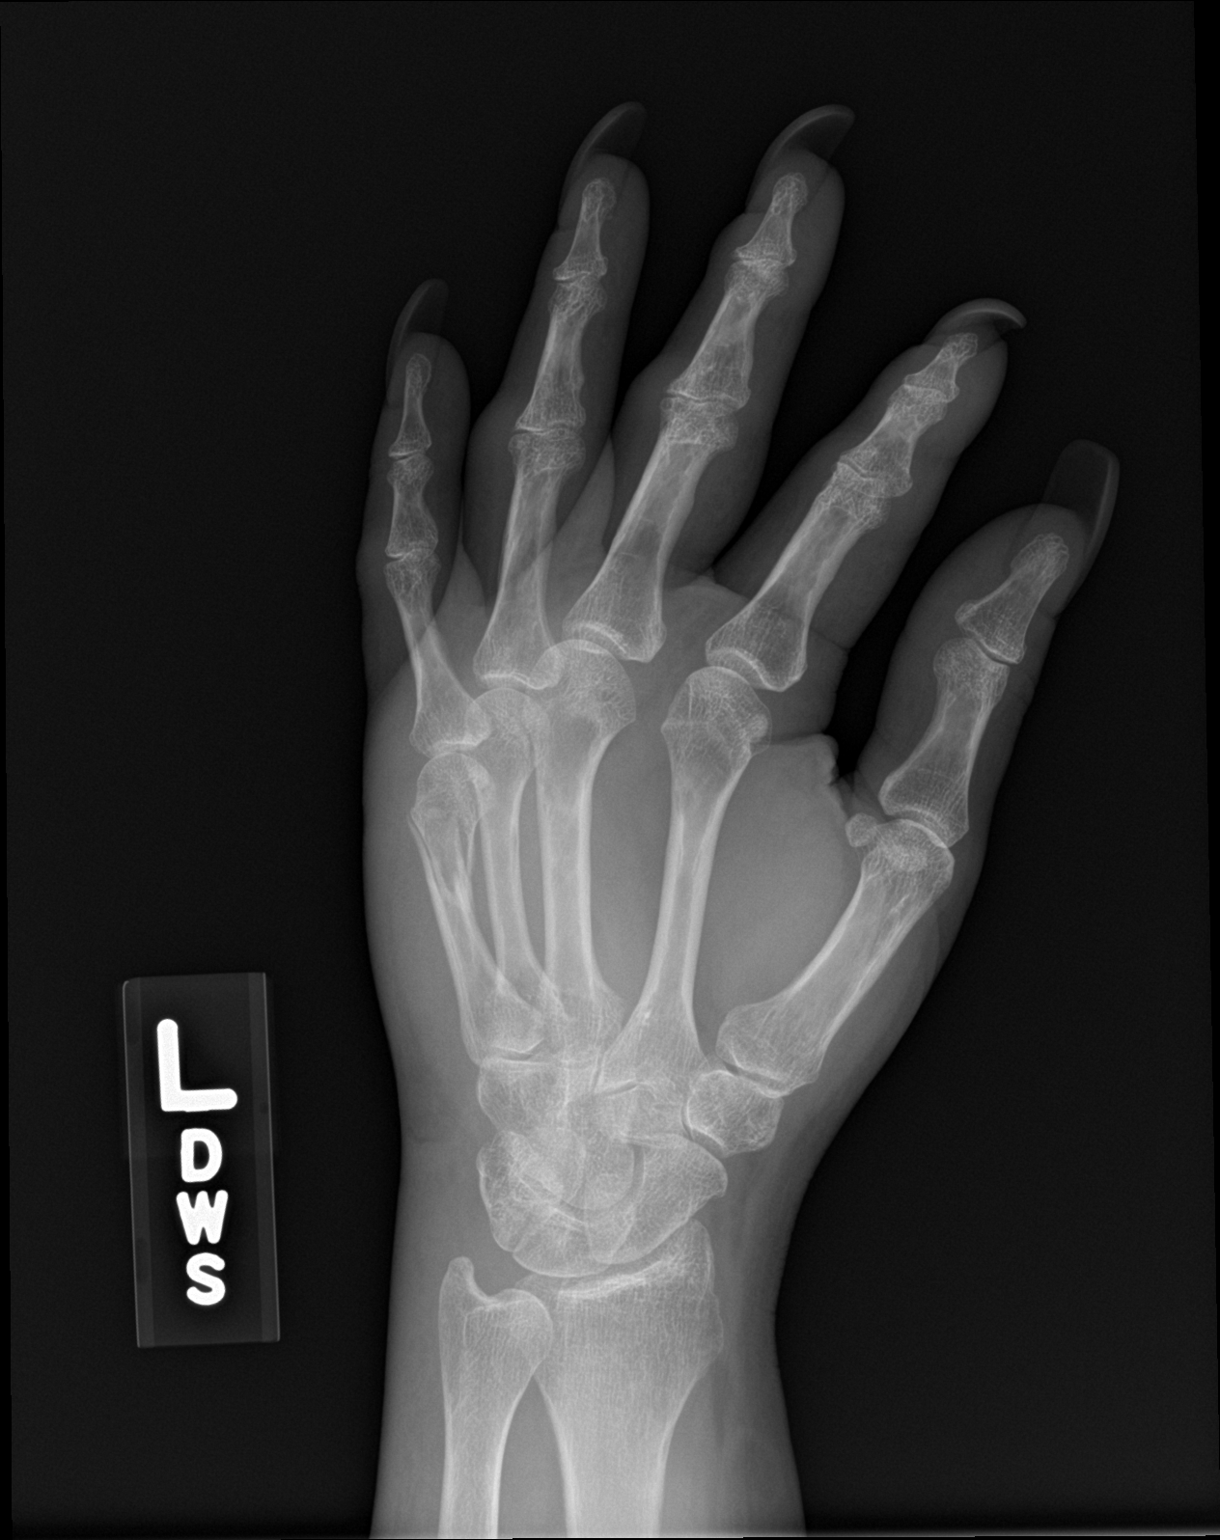

[hand lat]
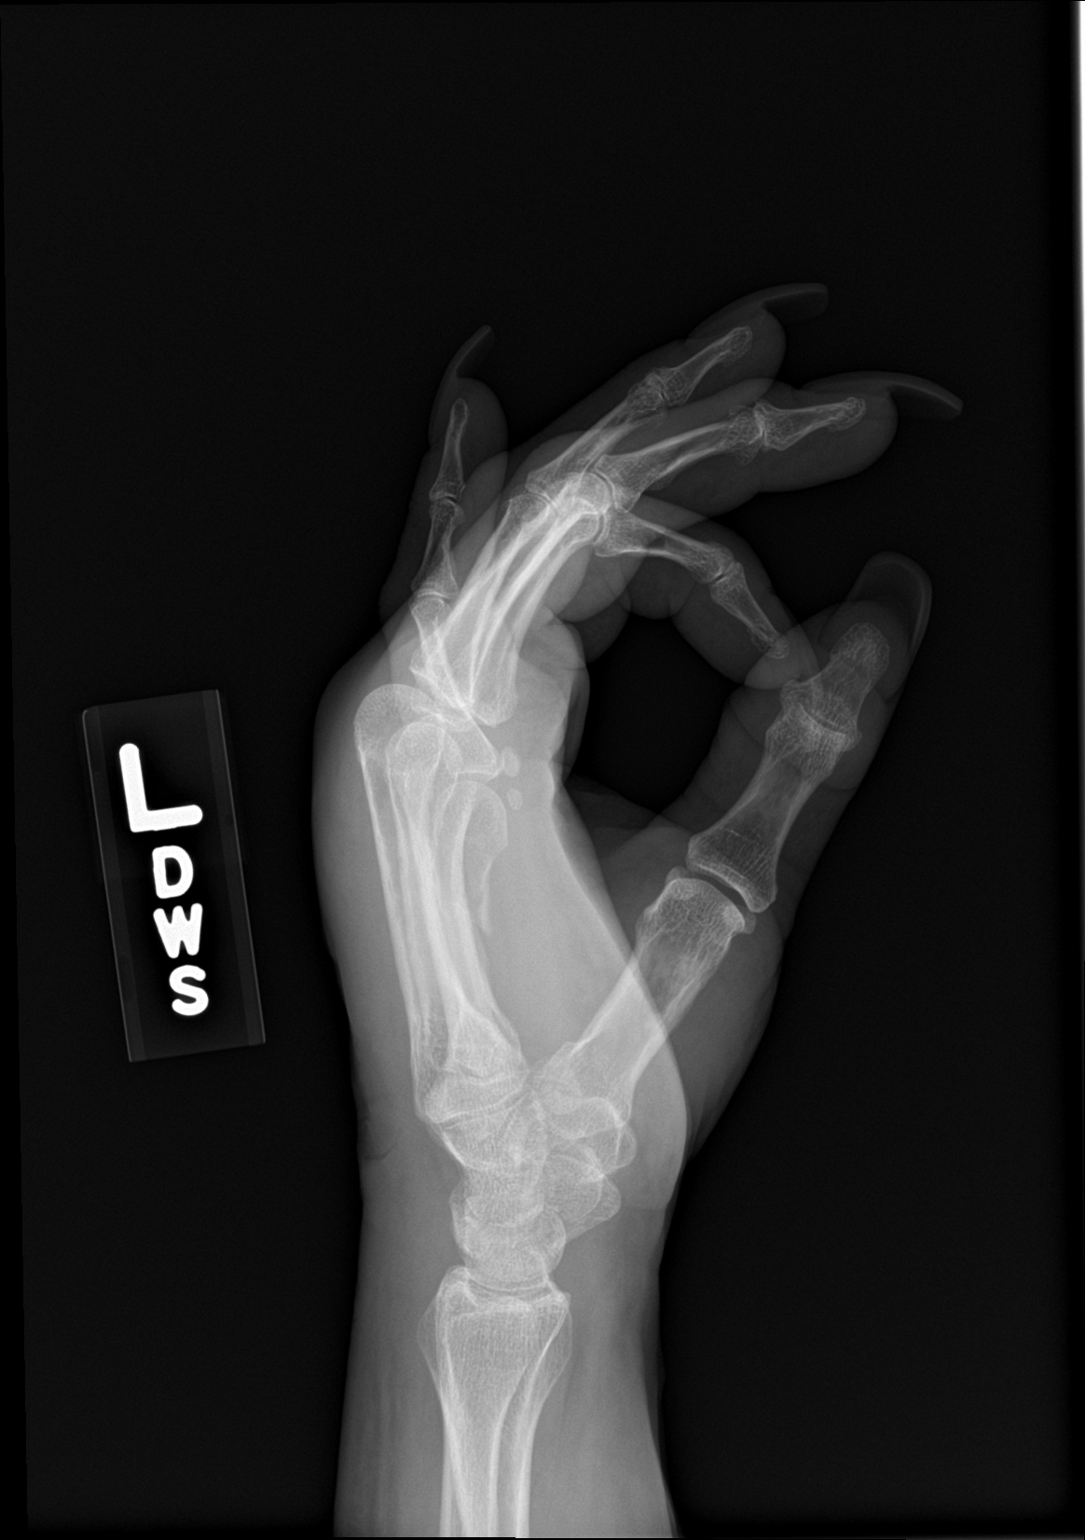

[3 of 3 positions shown; findings below may reference images not displayed]

FINDINGS: Minimally displaced oblique fracture of the distal fifth metacarpal.
No intra-articular extension. Soft tissue swelling overlying the
lateral aspect of.
IMPRESSION: Minimally displaced oblique fracture of the distal fifth metacarpal.

## 2022-06-08 IMAGING — CR DG RIBS W/ CHEST 3+V*L*
3 series · 3 of 3 positions shown · non-contrast
Comparison: None.

CLINICAL DATA: Rib pain after fall.

EXAM:
LEFT RIBS AND CHEST - 3+ VIEW

[chest pa]
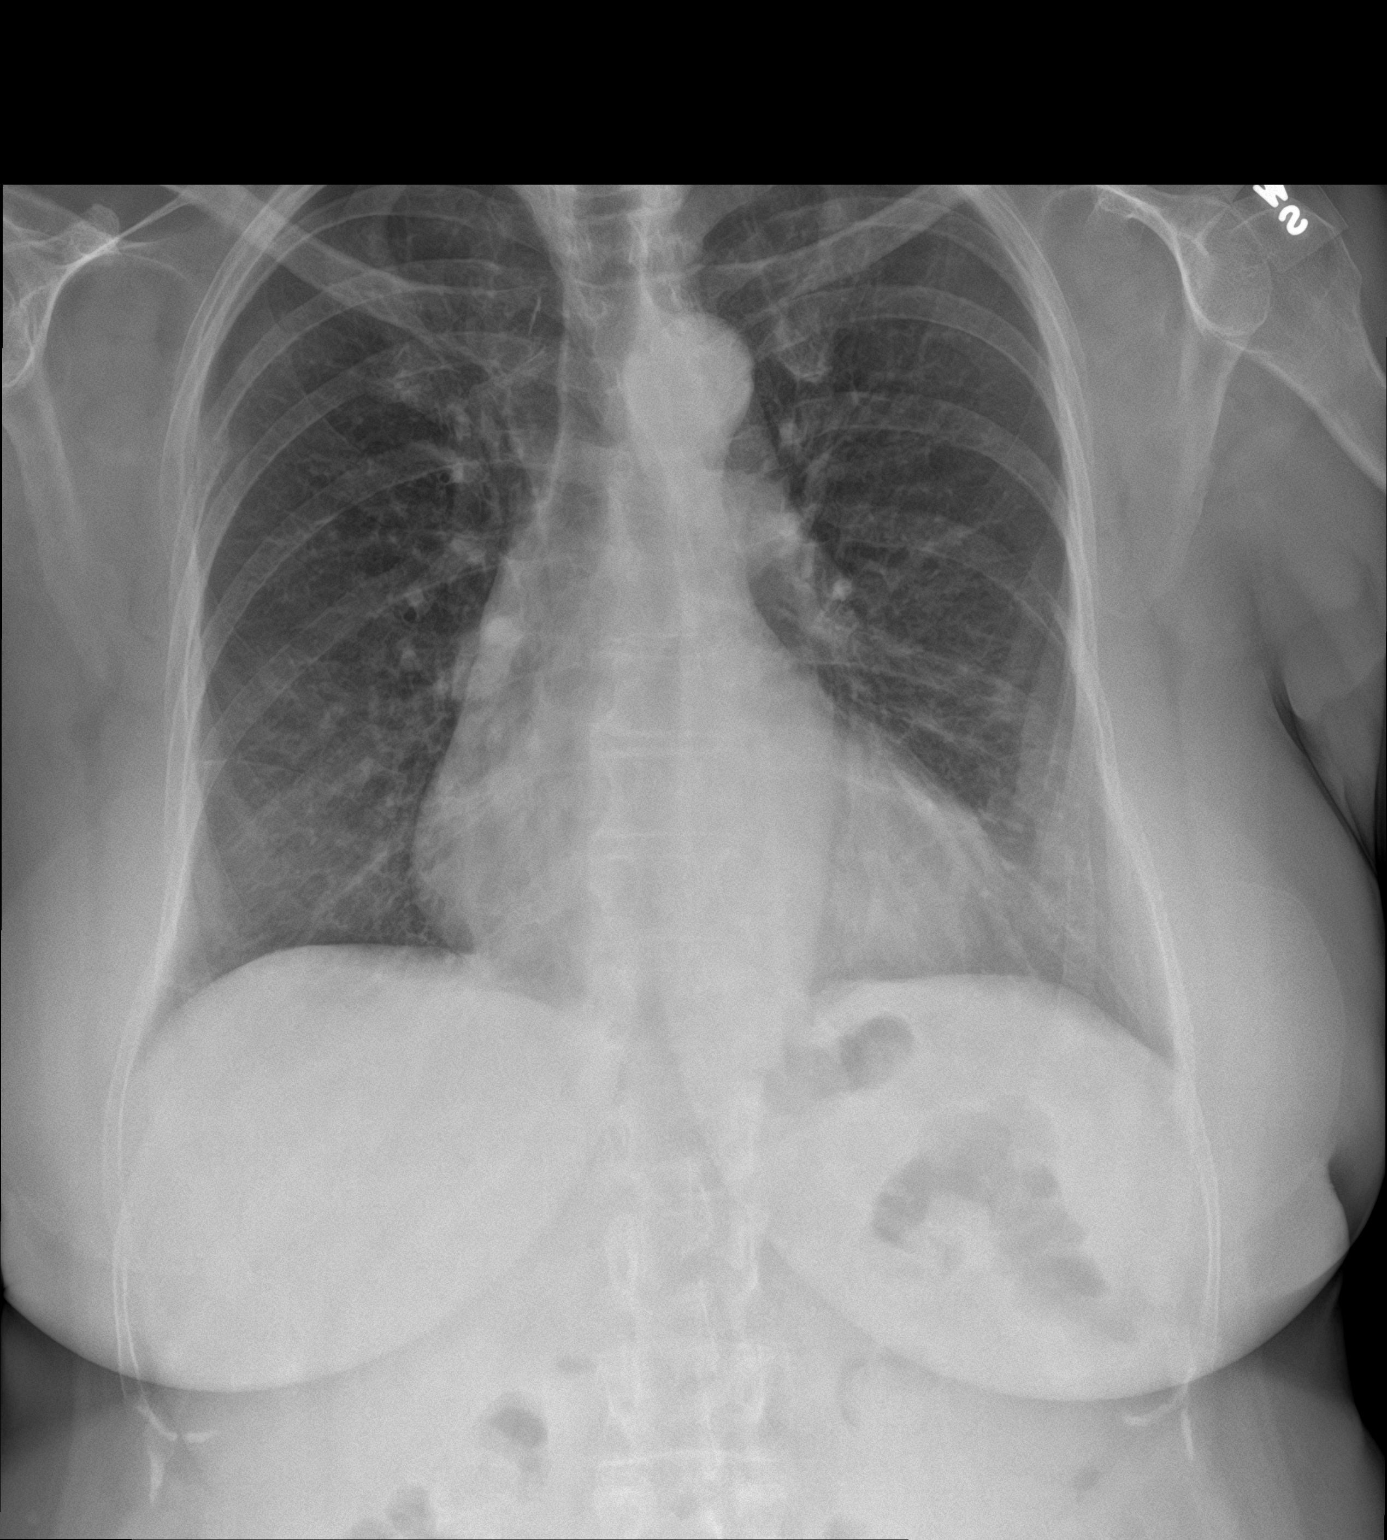

[rib pa]
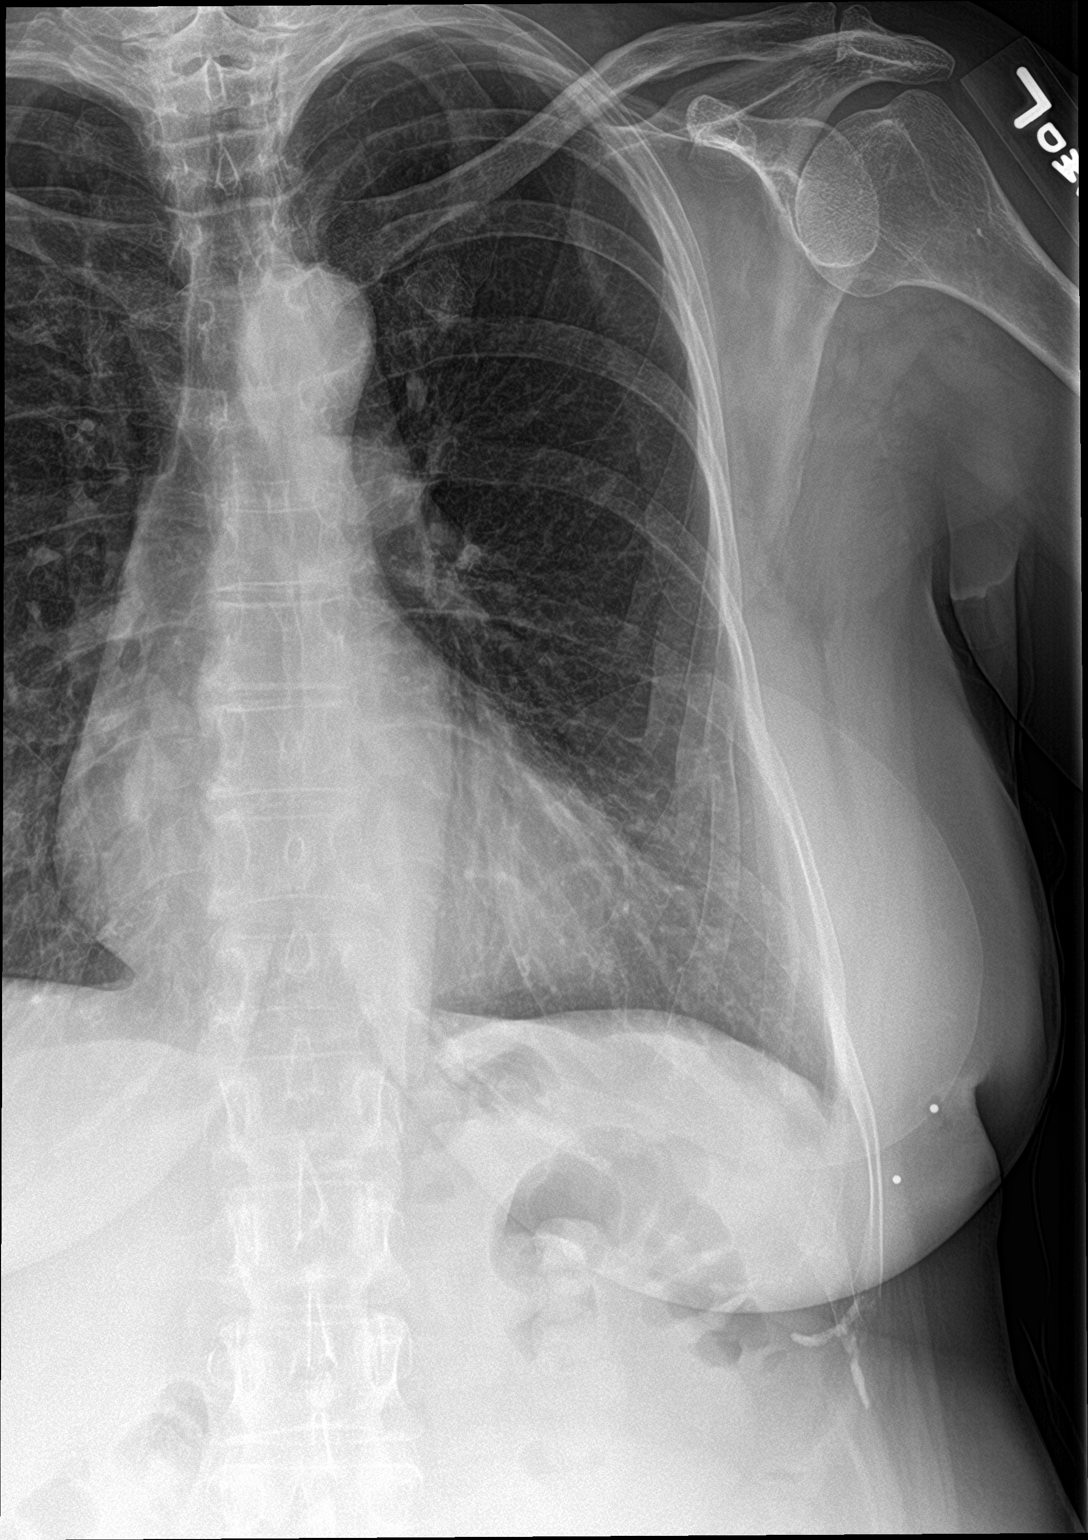

[rib pa obl]
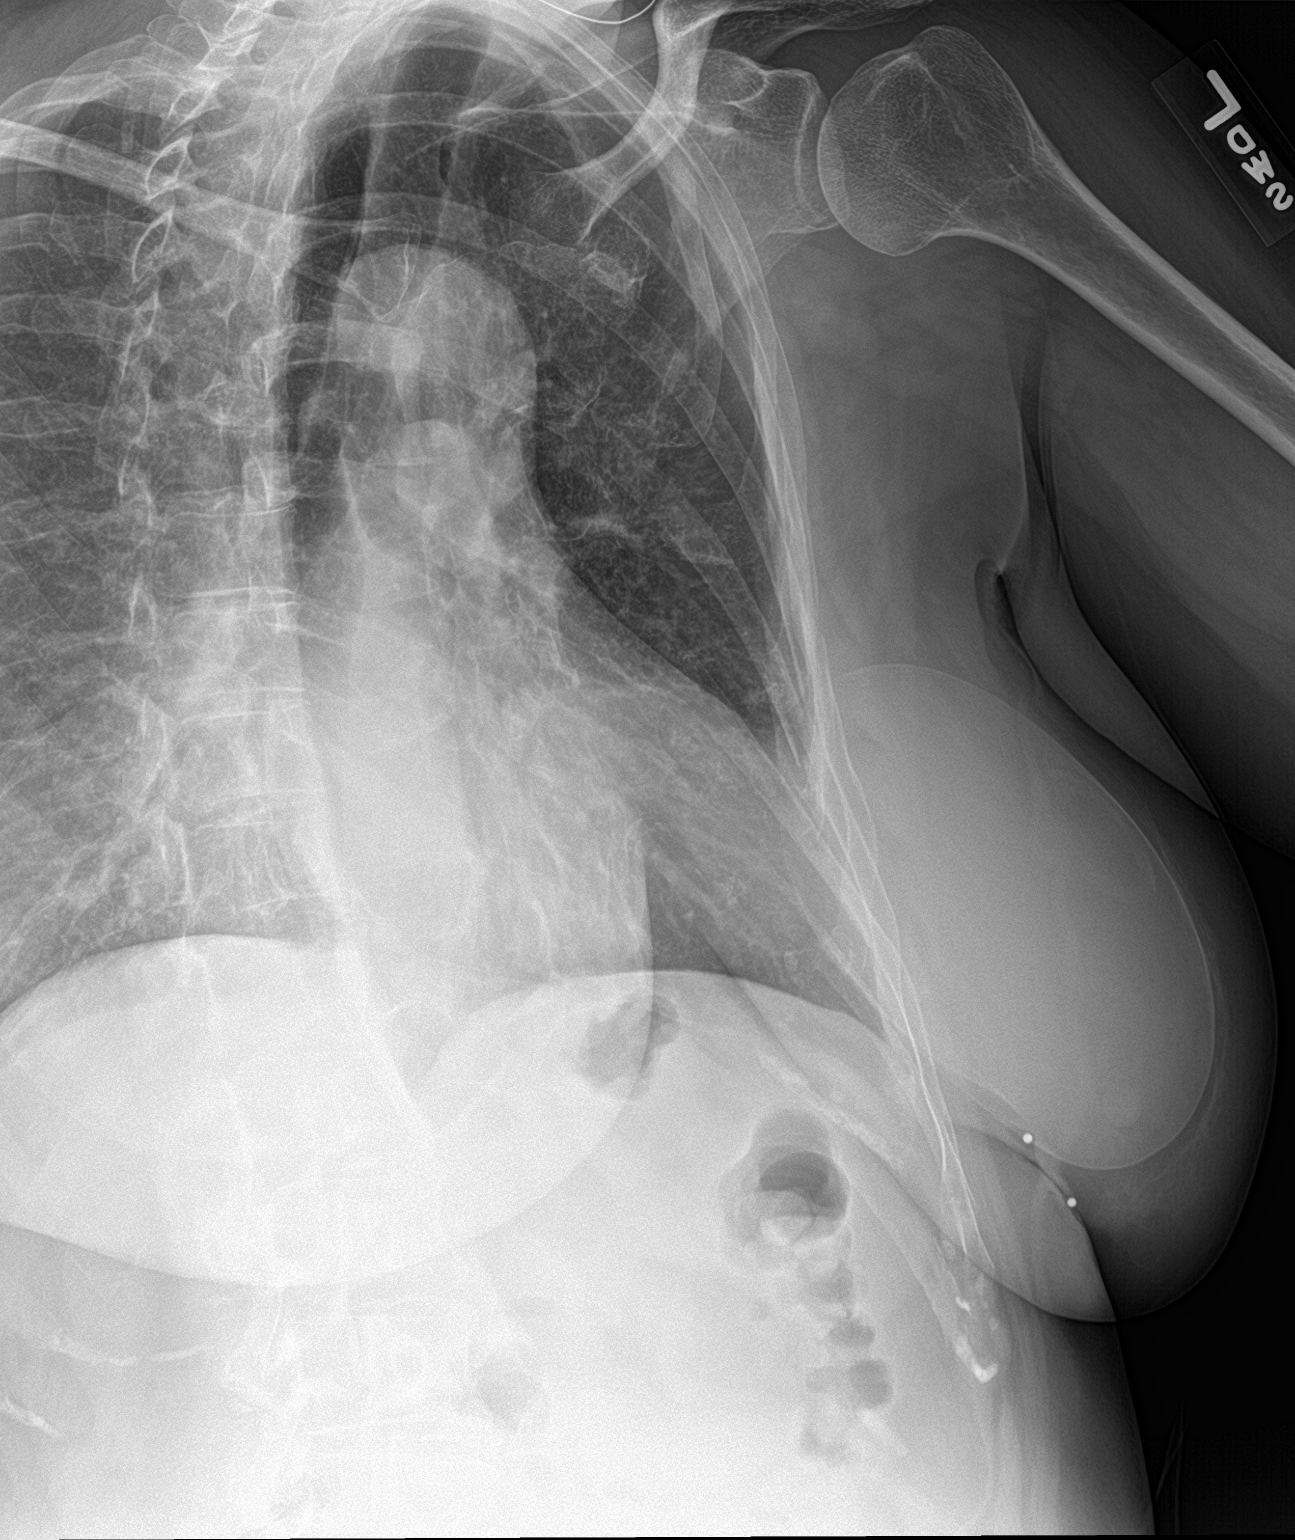

[3 of 3 positions shown; findings below may reference images not displayed]

FINDINGS: Osseous deformities the lateral aspect of the left fifth and sixth
ribs of unknown chronicity. No displaced rib fractures corresponding
with the radiopaque BBs marking patient's area pain. There is no
evidence of pneumothorax or pleural effusion. Both lungs are clear.
Heart size and mediastinal contours are within normal limits.
IMPRESSION: Osseous deformities of the lateral aspect of the left fifth and
sixth ribs of unknown chronicity, which are away from the palpable
area of concern as evident by the radiopaque markers placed at the
site of patient's pain. Recommend correlation with physical exam to
assess for point tenderness to assess acuity.

## 2022-06-25 ENCOUNTER — Encounter: Payer: Self-pay | Admitting: Internal Medicine

## 2022-07-10 ENCOUNTER — Other Ambulatory Visit: Payer: Self-pay

## 2022-08-08 ENCOUNTER — Other Ambulatory Visit: Payer: Self-pay | Admitting: Internal Medicine

## 2022-08-08 ENCOUNTER — Other Ambulatory Visit: Payer: Self-pay | Admitting: Family

## 2022-08-08 NOTE — Telephone Encounter (Signed)
Please reschedule overdue F/U appointment. Patient did not show for last scheduled office visit. Thank you! 

## 2022-08-21 ENCOUNTER — Encounter: Payer: Self-pay | Admitting: Nurse Practitioner

## 2022-08-21 ENCOUNTER — Ambulatory Visit: Payer: Medicaid Other | Attending: Internal Medicine | Admitting: Nurse Practitioner

## 2022-08-21 ENCOUNTER — Other Ambulatory Visit
Admission: RE | Admit: 2022-08-21 | Discharge: 2022-08-21 | Disposition: A | Discharge status: Home / Self Care | Payer: Medicaid Other | Attending: Internal Medicine | Admitting: Internal Medicine

## 2022-08-21 ENCOUNTER — Telehealth: Payer: Self-pay | Admitting: Nurse Practitioner

## 2022-08-21 VITALS — BP 120/56 | HR 65 | Ht 60.0 in | Wt 147.8 lb

## 2022-08-21 DIAGNOSIS — I2510 Atherosclerotic heart disease of native coronary artery without angina pectoris: Secondary | ICD-10-CM | POA: Diagnosis present

## 2022-08-21 DIAGNOSIS — I255 Ischemic cardiomyopathy: Secondary | ICD-10-CM

## 2022-08-21 DIAGNOSIS — E785 Hyperlipidemia, unspecified: Secondary | ICD-10-CM | POA: Insufficient documentation

## 2022-08-21 DIAGNOSIS — I5022 Chronic systolic (congestive) heart failure: Secondary | ICD-10-CM | POA: Diagnosis present

## 2022-08-21 DIAGNOSIS — Z720 Tobacco use: Secondary | ICD-10-CM

## 2022-08-21 LAB — COMPREHENSIVE METABOLIC PANEL
ALT: 21 U/L (ref 0–44)
AST: 22 U/L (ref 15–41)
Albumin: 3.9 g/dL (ref 3.5–5.0)
Alkaline Phosphatase: 89 U/L (ref 38–126)
Anion gap: 7 (ref 5–15)
BUN: 17 mg/dL (ref 6–20)
CO2: 26 mmol/L (ref 22–32)
Calcium: 8.9 mg/dL (ref 8.9–10.3)
Chloride: 105 mmol/L (ref 98–111)
Creatinine, Ser: 0.83 mg/dL (ref 0.44–1.00)
GFR, Estimated: >60 mL/min (ref >60)
Glucose, Bld: 93 mg/dL (ref 70–99)
Potassium: 3.9 mmol/L (ref 3.5–5.1)
Sodium: 138 mmol/L (ref 135–145)
Total Bilirubin: 0.7 mg/dL (ref 0.3–1.2)
Total Protein: 6.6 g/dL (ref 6.5–8.1)

## 2022-08-21 LAB — LIPID PANEL
Cholesterol: 159 mg/dL (ref 0–200)
HDL: 72 mg/dL (ref >40)
LDL Cholesterol: 74 mg/dL (ref 0–99)
Total CHOL/HDL Ratio: 2.2 RATIO
Triglycerides: 67 mg/dL (ref <150)
VLDL: 13 mg/dL (ref 0–40)

## 2022-08-21 MED ORDER — NITROGLYCERIN 0.4 MG SL SUBL: 0.4000 mg | SUBLINGUAL_TABLET | SUBLINGUAL | 3 refills | Status: DC | PRN

## 2022-08-21 MED ORDER — SACUBITRIL-VALSARTAN 24-26 MG PO TABS: 1.0000 | ORAL_TABLET | Freq: Two times a day (BID) | ORAL | 3 refills | Status: DC

## 2022-08-21 NOTE — Progress Notes (Signed)
Office Visit    Patient Name: Gabrielle Riley Date of Encounter: 08/21/2022  Primary Care Provider:  [facility] Primary Cardiologist:  Gabrielle Kendall, MD  Chief Complaint    57 y/o ? 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  Past Medical History    Past Medical History:  Diagnosis Date   Acute ST elevation myocardial infarction (STEMI) of inferolateral wall (HCC)    a. 11/2020 STEMI 2/2 SCAD involving OM2-->Med rx.   CAD (coronary artery disease)    a. 11/2020 STEMI/Cath: LM nl, LAD 42m, LCX nl, OM2 75/100 (SCAD), RCA 15p-->Med Rx.   FHx: rheumatic fever    Hyperlipidemia LDL goal <70    Hypothyroidism    Spontaneous dissection of coronary artery    a. 11/2020 ->OM2 75/100-->Med rx; b. 01/2021 Renal Duplex: No RAS or evidence of FMD. Nl Celiac/SMA/IMA.   Stress-induced cardiomyopathy    a. 11/2020 Echo: EF 45-50%, apical HK w/ hypercontractile LV basal segments sugg of stress-induced CM. Gr1 DD, nl RV fxn; b. 01/2021 Echo: EF 45-50%, sev mid-dist peri-apical/apical HK. Basal wall well preserved. Nl RV fxn.   Tobacco abuse    Past Surgical History:  Procedure Laterality Date   CARDIAC CATHETERIZATION     ECTOPIC PREGNANCY SURGERY     LEFT HEART CATH AND CORONARY ANGIOGRAPHY N/A 12/11/2020   Procedure: LEFT HEART CATH AND CORONARY ANGIOGRAPHY;  Surgeon: Gabrielle Kendall, MD;  Location: [facility] INVASIVE CV LAB;  Service: Cardiovascular;  Laterality: N/A;    Allergies  Allergies  Allergen Reactions   Imitrex [Sumatriptan]     Throat closing   Sulfa Antibiotics     History of Present Illness    57 year old female with above complex past medical history including spontaneous coronary artery dissection, coronary artery disease, stress-induced cardiomyopathy, hyperlipidemia,  hypothyroidism, and tobacco abuse.  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  This was too small for PCI CHA2DS2VASc labs.  Echo showed EF of 45 to 50% with apical hypokinesis and otherwise hypercontractile LV basal segment suggestive of stress-induced cardiomyopathy.  She was placed on GDMT.  Follow-up renal arterial duplex was negative for large stenosis or evidence of fibromuscular dysplasia.  Follow-up echo in October 2022 showed persistent mild LV dysfunction with an EF of 45 to 50% with severe mid to distal.  Apical/apical hypokinesis, while the basal wall remains well-preserved.  [name] has not been seen in cardiology clinic since March 2023.  Since then however, she notes that she has been feeling well.  She walks regularly without symptoms or limitations.  She denies chest pain, palpitations, dyspnea, pnd, orthopnea, n, v, dizziness, syncope, edema, weight gain, or early satiety.   Home Medications    Current Outpatient Medications  Medication Sig Dispense Refill   aspirin 81 MG chewable tablet Chew 1 tablet (81 mg total) by mouth once daily. 90 tablet 3   atorvastatin (LIPITOR) 20 MG tablet Take 1 tablet (20 mg total) by mouth once daily. 90 tablet 3   carvedilol (COREG) 3.125 MG tablet Take 1 tablet (3.125 mg total) by mouth 2 (two) times daily with a meal. 180 tablet 3   furosemide (LASIX) 20 MG tablet Take 1 tablet (20 mg total) by mouth once  daily. 90 tablet 3   levothyroxine (SYNTHROID) 100 MCG tablet Take 100 mcg by mouth daily before breakfast.     nitroGLYCERIN (NITROSTAT) 0.4 MG SL tablet Place 1 tablet (0.4 mg total) under the tongue every 5 (five) minutes as needed for chest pain. 25 tablet 3   sacubitril-valsartan (ENTRESTO) 24-26 MG Take 1 tablet by mouth 2 (two) times daily. 180 tablet 3   No current facility-administered medications for  this visit.     Review of Systems    She denies chest pain, palpitations, dyspnea, pnd, orthopnea, n, v, dizziness, syncope, edema, weight gain, or early satiety.  All other systems reviewed and are otherwise negative except as noted above.    Physical Exam    VS:  BP (!) 120/56   Pulse 65   Ht 5' (1.524 m)   Wt 147 lb 12.8 oz (67 kg)   SpO2 97%   BMI 28.87 kg/m  , BMI Body mass index is 28.87 kg/m.     GEN: Well nourished, well developed, in no acute distress. HEENT: normal. Neck: Supple, no JVD, carotid bruits, or masses. Cardiac: RRR, no murmurs, rubs, or gallops. No clubbing, cyanosis, edema.  Radials 2+/PT 2+ and equal bilaterally.  Respiratory:  Respirations regular and unlabored, clear to auscultation bilaterally. GI: Soft, nontender, nondistended, BS + x 4. MS: no deformity or atrophy. Skin: warm and dry, no rash. Neuro:  Strength and sensation are intact. Psych: Normal affect.  Accessory Clinical Findings    ECG personally reviewed by me today - RSR, 62, inferior and anterolateral TWI - no acute changes.  Labs dated July 29, 2021:  Hemoglobin 12.4, hematocrit 36.9, WBC 6.7, platelets 262 Sodium 138, potassium 4.1, chloride 107, CO2 26, BUN 17, creatinine 0.8, glucose 93 Total bilirubin 0.3, alkaline phosphatase 75, AST 22, ALT 24 Calcium 8.9, albumin 3.7, total protein 5.9 TSH 1.02 Total cholesterol 161, triglycerides 106, HDL 72, LDL 68 _____________   Assessment & Plan    1.  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  Renal arterial duplex was negative for stenosis or evidence of fibromuscular dysplasia.  She has done well over the past year without symptoms or limitations and remains on aspirin, statin, beta-blocker, and Entresto.  2.  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  She is euvolemic on examination, asymptomatic, and normotensive.  Continue beta-blocker, Entresto, and low-dose furosemide therapy.  Follow-up complete metabolic panel.  3.  Hyperlipidemia: She has not had lipids since last year.  Follow-up today.  4.  Tobacco abuse: Continues to smoke 1/4-1/3 of a pack a day.  Discussed benefits of quitting.  She says that she's going to try using nicotine patches.  5.  Disposition:  F/u labs today.  F/u in clinic in 6 mos or sooner if necessary.  Nicolasa Ducking, NP 08/21/2022, 12:15 PM

## 2022-08-21 NOTE — Telephone Encounter (Signed)
The patient was seen in the office today by Solon Palm. She brought her [employer] Patient Assistance re-enrollment paperwork with her.  This was signed and completed by the patient and provider and faxed to [employer] at [phone].  Fax confirmation received.  There was no proof of income submitted. Confirmed with the patient that she is not currently employed and has no insurance/ source of income.  All completed paper work placed in [employer] for assistance applications.

## 2022-08-21 NOTE — Patient Instructions (Signed)
Medication Instructions:  - Your physician recommends that you continue on your current medications as directed. Please refer to the Current Medication list given to you today.  *If you need a refill on your cardiac medications before your next appointment, please call your pharmacy*   Lab Work: - Your physician recommends that you have lab work today:  CMET/ Lipid Pharmacologist at [facility] 1st desk on the right to check in (REGISTRATION)  Lab hours: Monday- Friday (7:30 am- 5:30 pm)   If you have labs (blood work) drawn today and your tests are completely normal, you will receive your results only by: MyChart Message (if you have MyChart) OR A paper copy in the mail If you have any lab test that is abnormal or we need to change your treatment, we will call you to review the results.   Testing/Procedures: - none ordered   Follow-Up: At [facility], you and your health needs are our priority.  As part of our continuing mission to provide you with exceptional heart care, we have created designated Provider Care Teams.  These Care Teams include your primary Cardiologist (physician) and Advanced Practice Providers (APPs -  Physician Assistants and Nurse Practitioners) who all work together to provide you with the care you need, when you need it.  We recommend signing up for the patient portal called "MyChart".  Sign up information is provided on this After Visit Summary.  MyChart is used to connect with patients for Virtual Visits (Telemedicine).  Patients are able to view lab/test results, encounter notes, upcoming appointments, etc.  Non-urgent messages can be sent to your provider as well.   To learn more about what you can do with MyChart, go to [email or web address].    Your next appointment:   6 month(s)  Provider:   You may see Yvonne Kendall, MD or one of the following Advanced Practice Providers on your designated Care Team:   Nicolasa Ducking, NP     Other Instructions N/a

## 2022-08-22 ENCOUNTER — Other Ambulatory Visit: Payer: Self-pay

## 2022-08-22 ENCOUNTER — Other Ambulatory Visit: Payer: Self-pay | Admitting: *Deleted

## 2022-08-22 DIAGNOSIS — E785 Hyperlipidemia, unspecified: Secondary | ICD-10-CM

## 2022-08-22 DIAGNOSIS — I2510 Atherosclerotic heart disease of native coronary artery without angina pectoris: Secondary | ICD-10-CM

## 2022-08-22 MED ORDER — ATORVASTATIN CALCIUM 40 MG PO TABS
40.0000 mg | ORAL_TABLET | Freq: Every day | ORAL | 3 refills | Status: DC
  Filled 2022-08-22: qty 30, 30d supply, fill #0

## 2022-09-05 ENCOUNTER — Other Ambulatory Visit: Payer: Self-pay

## 2022-09-27 ENCOUNTER — Other Ambulatory Visit: Payer: Self-pay | Admitting: Family

## 2022-09-30 MED ORDER — ATORVASTATIN CALCIUM 40 MG PO TABS: 40.0000 mg | ORAL_TABLET | Freq: Every day | ORAL | 3 refills | Status: DC

## 2022-10-03 ENCOUNTER — Telehealth: Payer: Self-pay | Admitting: Internal Medicine

## 2022-10-03 NOTE — Telephone Encounter (Signed)
Patient calling the office for samples of medication:   1.  What medication and dosage are you requesting samples for? Entresto  2.  Are you currently out of this medication?  Will be out of it ,after tomorrow

## 2022-10-03 NOTE — Telephone Encounter (Signed)
Spoke with pt concerning request for Samples Entrestor 24/26 mg tablet. She was made aware that samples are only for new pt starts. Pt mentioned that she has been approved through [employer] pt assistance and is currently awaiting her shipment once she responds to signature needed on her behalf. Pt has reached out to [employer] and mentioned that she has already addressed the needed information. She was frustrated and mentioned that she should be fine after this request.  Placed 1 bottle upfront for pick up. Pt made aware.  Entresto 24-26 mg [id number] Exp:10/2023

## 2022-10-18 ENCOUNTER — Other Ambulatory Visit: Payer: Self-pay | Admitting: Family

## 2022-10-20 ENCOUNTER — Telehealth: Payer: Self-pay

## 2022-10-26 ENCOUNTER — Encounter: Payer: Self-pay | Admitting: Family

## 2022-10-27 NOTE — Telephone Encounter (Signed)
Medication Samples have been provided to the patient.  Drug name: Sherryll Burger       Strength: 24-26 mg        Qty: 1  LOT: [id number]  Exp.Date: 11/2023  Dosing instructions: Take one tablet by mouth two times daily  The patient has been instructed regarding the correct time, dose, and frequency of taking this medication, including desired effects and most common side effects.   Simonne Maffucci 9:59 AM 10/27/2022

## 2022-10-30 NOTE — Telephone Encounter (Signed)
Called and spoke with pt about medication refills and needing to be seen at least once yearly to get refills. Pt scheduled for 7/18/ @ 11:30 AM. Pt had good understanding and no further questions.

## 2022-11-06 ENCOUNTER — Ambulatory Visit (HOSPITAL_BASED_OUTPATIENT_CLINIC_OR_DEPARTMENT_OTHER): Payer: Medicaid Other | Admitting: Family

## 2022-11-06 ENCOUNTER — Other Ambulatory Visit
Admission: RE | Admit: 2022-11-06 | Discharge: 2022-11-06 | Disposition: A | Discharge status: Home / Self Care | Payer: Medicaid Other | Source: Ambulatory Visit | Attending: Family | Admitting: Family

## 2022-11-06 ENCOUNTER — Encounter: Payer: Self-pay | Admitting: Family

## 2022-11-06 VITALS — BP 123/90 | HR 78 | Wt 145.8 lb

## 2022-11-06 DIAGNOSIS — I5022 Chronic systolic (congestive) heart failure: Secondary | ICD-10-CM

## 2022-11-06 LAB — BASIC METABOLIC PANEL
Anion gap: 7 (ref 5–15)
BUN: 18 mg/dL (ref 6–20)
CO2: 25 mmol/L (ref 22–32)
Calcium: 9.2 mg/dL (ref 8.9–10.3)
Chloride: 105 mmol/L (ref 98–111)
Creatinine, Ser: 0.78 mg/dL (ref 0.44–1.00)
GFR, Estimated: >60 mL/min (ref >60)
Glucose, Bld: 101 mg/dL — ABNORMAL HIGH (ref 70–99)
Potassium: 4 mmol/L (ref 3.5–5.1)
Sodium: 137 mmol/L (ref 135–145)

## 2022-11-06 NOTE — Patient Instructions (Addendum)
Go the [facility] entrance to get your lab work drawn.   Take the furosemide (lasix) if you need it for swelling or weight gain.

## 2022-11-06 NOTE — Progress Notes (Signed)
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   Has not been admitted or been in the ED in the last year.   Echo 12/12/20: EF of 45-50% along with apical hypokinesis suggesting Takotsubo Echo 02/01/21: EF of 45-50%.   LHC done 12/11/20 and showed  Inferolateral STEMI due to spontaneous coronary artery dissection of small-moderate caliber branch arising from OM2.  Vessel is too small/distal for intervention. Mild, nonobstructive coronary artery disease involving the LAD and nondominant RCA. Moderately reduced left ventricular systolic function with apical akinesis and otherwise preserved left ventricular ejection fraction.  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 Moderately-severely elevated left ventricular filling pressure consistent with acute heart failure.  She presents today for a HF follow-up visit with a chief complaint of minimal SOB with moderate exertion. Chronic in nature. Has worsened with the hot/ humid weather. Has associated fatigue along with this. Denies chest pain, cough, palpitations, abdominal distention, pedal edema, dizziness or difficulty sleeping.   Currently is no longer working. Only takes her furosemide PRN because it makes her urinate too much. She says that she hasn't taken furosemide in a "long time"  ROS: All systems negative except as listed in HPI, PMH and Problem List.  SH:  Social History   Socioeconomic History   Marital status: Significant Other    Spouse name: Not on file   Number of children: Not on file   Years of education: Not on file   Highest education level: Not on file  Occupational History    Not on file  Tobacco Use   Smoking status: Every Day    Current packs/day: 0.50    Average packs/day: 0.5 packs/day for 25.0 years (12.5 ttl pk-yrs)    Types: Cigarettes   Smokeless tobacco: Never   Tobacco comments:    Ready to Quit, has not set a quit date yet.   Vaping Use   Vaping status: Never Used  Substance and Sexual Activity   Alcohol use: Not Currently    Comment: 1-2 drinks occassionally   Drug use: Not Currently   Sexual activity: Not on file  Other Topics Concern   Not on file  Social History Narrative   Not on file   Social Determinants of Health   Financial Resource Strain: Not on file  Food Insecurity: Not on file  Transportation Needs: Not on file  Physical Activity: Not on file  Stress: Not on file  Social Connections: Not on file  Intimate Partner Violence: Not on file    FH:  Family History  Problem Relation Age of Onset   Diabetes Sister    Heart attack Brother     Past Medical History:  Diagnosis Date   Acute ST elevation myocardial infarction (STEMI) of inferolateral wall (HCC)    a. 11/2020 STEMI 2/2 SCAD involving OM2-->Med rx.   CAD (coronary artery disease)    a. 11/2020 STEMI/Cath: LM nl, LAD 60m, LCX nl, OM2 75/100 (SCAD), RCA 15p-->Med Rx.   FHx: rheumatic fever    Hyperlipidemia LDL goal <70    Hypothyroidism    Spontaneous dissection of coronary artery    a. 11/2020 ->OM2 75/100-->Med rx; b. 01/2021 Renal Duplex: No RAS or evidence of  FMD. Nl Celiac/SMA/IMA.   Stress-induced cardiomyopathy    a. 11/2020 Echo: EF 45-50%, apical HK w/ hypercontractile LV basal segments sugg of stress-induced CM. Gr1 DD, nl RV fxn; b. 01/2021 Echo: EF 45-50%, sev mid-dist peri-apical/apical HK. Basal wall well preserved. Nl RV fxn.   Tobacco abuse     Current Outpatient Medications  Medication Sig Dispense Refill   aspirin 81 MG chewable tablet Chew 1 tablet (81 mg total) by mouth once daily. 90 tablet 3   atorvastatin (LIPITOR) 40 MG tablet Take 1  tablet (40 mg total) by mouth daily. 90 tablet 3   carvedilol (COREG) 3.125 MG tablet TAKE 1 TABLET BY MOUTH TWICE A DAY WITH A MEAL 60 tablet 1   furosemide (LASIX) 20 MG tablet Take 1 tablet (20 mg total) by mouth once daily. 90 tablet 3   levothyroxine (SYNTHROID) 100 MCG tablet Take 100 mcg by mouth daily before breakfast.     nitroGLYCERIN (NITROSTAT) 0.4 MG SL tablet Place 1 tablet (0.4 mg total) under the tongue every 5 (five) minutes as needed for chest pain. 25 tablet 3   sacubitril-valsartan (ENTRESTO) 24-26 MG Take 1 tablet by mouth 2 (two) times daily. 180 tablet 3   No current facility-administered medications for this visit.    Vitals:   11/06/22 1143  BP: (!) 123/90  Pulse: 78  SpO2: 94%  Weight: 145 lb 12.8 oz (66.1 kg)   Wt Readings from Last 3 Encounters:  11/06/22 145 lb 12.8 oz (66.1 kg)  08/21/22 147 lb 12.8 oz (67 kg)  07/05/21 145 lb (65.8 kg)   Lab Results  Component Value Date   CREATININE 0.78 11/06/2022   CREATININE 0.83 08/21/2022   CREATININE 0.82 12/21/2020   PHYSICAL EXAM:  General:  Well appearing. No resp difficulty HEENT: normal Neck: supple. JVP flat. No lymphadenopathy or thryomegaly appreciated. Cor: PMI normal. Regular rate & rhythm. No rubs, gallops or murmurs. Lungs: clear Abdomen: soft, nontender, nondistended. No hepatosplenomegaly. No bruits or masses.  Extremities: no cyanosis, clubbing, rash, edema Neuro: alert & oriented x3, cranial nerves grossly intact. Moves all 4 extremities w/o difficulty. Affect pleasant.   ECG:not done   ASSESSMENT & PLAN:  1: Chronic ischemic heart failure with mildly reduced ejection fraction (Takotsubo)- - thought to be stress induced - NYHA class II - euvolemic today - weighing daily; reminded to call for an overnight weight gain of > 2 pounds or a weekly weight gain of > 5 pounds - Echo 12/12/20: EF of 45-50% along with apical hypokinesis suggesting Takotsubo - Echo 02/01/21: EF of 45-50%. -  will get echo updated & then consider adding SGLT/ spiro - not adding salt to her food and has been reading food labels - continue carvedilol 3.125mg  BID - continue furosemide PRN - continue entresto 24/26mg  BID  2: History of STEMI due to spontaneous coronary artery dissection- - saw cardiology (End) 05/24 - LHC done 12/11/20 and showed  Inferolateral STEMI due to spontaneous coronary artery dissection of small-moderate caliber branch arising from OM2.  Vessel is too small/distal for intervention. Mild, nonobstructive coronary artery disease involving the LAD and nondominant RCA. Moderately reduced left ventricular systolic function with apical akinesis and otherwise preserved left ventricular ejection fraction.  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 Moderately-severely elevated left ventricular filling pressure consistent with acute heart failure. - BMP 11/06/22 reviewed and showed sodium 137, potassium 4.0, creatinine 0.78 & GFR >60 - LDL 74 on 08/21/22 - continue atorvastatin  40mg  daily  3: Tobacco- - smoking 1/4-1/3 ppd of cigarettes  - has previously stopped smoking for a few years  - complete cessation discussed with her  Return 1 week after echo, sooner if needed.

## 2022-11-11 ENCOUNTER — Telehealth: Payer: Self-pay

## 2022-11-11 NOTE — Telephone Encounter (Signed)
Called patient to schedule echocardiogram and follow up appointment with [name], FNP. Echo scheduled for Friday, 11/28/22 and f/u scheduled for 8/14. Explained to arrive at [facility] by 10:45 AM on 8/9, and reviewed instructions. Pt aware, agreeable, and verbalized understanding.

## 2022-11-13 ENCOUNTER — Other Ambulatory Visit: Payer: Self-pay | Admitting: Family

## 2022-11-19 DIAGNOSIS — R0781 Pleurodynia: Secondary | ICD-10-CM | POA: Diagnosis not present

## 2022-11-19 DIAGNOSIS — S51001A Unspecified open wound of right elbow, initial encounter: Secondary | ICD-10-CM | POA: Diagnosis not present

## 2022-11-19 DIAGNOSIS — Y9301 Activity, walking, marching and hiking: Secondary | ICD-10-CM | POA: Diagnosis not present

## 2022-11-19 DIAGNOSIS — W010XXA Fall on same level from slipping, tripping and stumbling without subsequent striking against object, initial encounter: Secondary | ICD-10-CM | POA: Diagnosis not present

## 2022-11-19 DIAGNOSIS — W19XXXA Unspecified fall, initial encounter: Secondary | ICD-10-CM | POA: Diagnosis not present

## 2022-11-22 ENCOUNTER — Emergency Department: Admission: EM | Admit: 2022-11-22 | Payer: Medicaid Other | Source: Home / Self Care

## 2022-11-22 ENCOUNTER — Emergency Department: Payer: Medicaid Other

## 2022-11-22 ENCOUNTER — Other Ambulatory Visit: Payer: Self-pay

## 2022-11-22 DIAGNOSIS — S2241XA Multiple fractures of ribs, right side, initial encounter for closed fracture: Secondary | ICD-10-CM | POA: Diagnosis not present

## 2022-11-22 DIAGNOSIS — W19XXXA Unspecified fall, initial encounter: Secondary | ICD-10-CM | POA: Diagnosis not present

## 2022-11-22 DIAGNOSIS — I700 Atherosclerosis of aorta: Secondary | ICD-10-CM | POA: Diagnosis not present

## 2022-11-22 DIAGNOSIS — R0781 Pleurodynia: Secondary | ICD-10-CM | POA: Diagnosis not present

## 2022-11-22 DIAGNOSIS — J9811 Atelectasis: Secondary | ICD-10-CM | POA: Diagnosis not present

## 2022-11-22 MED ORDER — LIDOCAINE 5 % EX PTCH
1.0000 | MEDICATED_PATCH | CUTANEOUS | Status: DC
  Administered 2022-11-22: 1 via TRANSDERMAL
  Filled 2022-11-22: qty 1

## 2022-11-22 MED ORDER — KETOROLAC TROMETHAMINE 30 MG/ML IJ SOLN
30.0000 mg | Freq: Once | INTRAMUSCULAR | Status: AC
  Administered 2022-11-22: 30 mg via INTRAMUSCULAR
  Filled 2022-11-22: qty 1

## 2022-11-22 MED ORDER — FENTANYL CITRATE PF 50 MCG/ML IJ SOSY
100.0000 ug | PREFILLED_SYRINGE | Freq: Once | INTRAMUSCULAR | Status: AC
  Administered 2022-11-22: 100 ug via INTRAMUSCULAR
  Filled 2022-11-22: qty 2

## 2022-11-22 MED ORDER — ACETAMINOPHEN 500 MG PO TABS
1000.0000 mg | ORAL_TABLET | Freq: Once | ORAL | Status: AC
  Administered 2022-11-22: 1000 mg via ORAL
  Filled 2022-11-22: qty 2

## 2022-11-22 NOTE — ED Triage Notes (Signed)
Pt reports she fell on Thursday and was seen in Urgent care had an x-ray and was informed no fracture to ribs, pt reports today her rib pain has increased unable to take deep breath.  Pt reports had laceration to right forearm sutured at urgent care but was not able to get her antibiotics  as it was not sent to pharmacy. Pt talks in complete sentences no distress noted. No respiratory distress noted

## 2022-11-22 NOTE — ED Provider Notes (Signed)
[facility] Provider Note    Event Date/Time   First MD Initiated Contact with Patient 11/22/22 2259     (approximate)   History   No chief complaint on file.   HPI  Gabrielle Riley is a 57 y.o. female who presents to the ED for evaluation of No chief complaint on file.   Review clinic visit from 7/31 and 8/1.  Fell on [employer], right elbow, right rib cage pain.  Reassuring x-rays then, sutures applied to right elbow and started on Keflex for infectious prophylaxis.  Tetanus UTD. 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  Patient presents to the ED for evaluation of poorly controlled right-sided chest wall discomfort after her fall a few days ago.  Reports using 600 ibuprofen which was not working so she stopped it and started taking Norco 5 mg which similarly is not controlling her pain.  Reports feeling like there is grinding bone sensation and she is confident there is a fractured rib.   No fevers or increased cough or sputum production.  No abdominal pain, hematochezia or hematuria, no emesis or stool changes.   Physical Exam   Triage Vital Signs: ED Triage Vitals  Encounter Vitals Group     BP 11/22/22 2206 124/83     Systolic BP Percentile --      Diastolic BP Percentile --      Pulse Rate 11/22/22 2206 94     Resp 11/22/22 2206 20     Temp 11/22/22 2206 98.3 F (36.8 C)     Temp Source 11/22/22 2206 Oral     SpO2 11/22/22 2206 93 %     Weight 11/22/22 2207 150 lb (68 kg)     Height 11/22/22 2207 5\' 1"  (1.549 m)     Head Circumference --      Peak Flow --      Pain Score 11/22/22 2216 10     Pain Loc --      Pain Education --      Exclude from Growth Chart --     Most recent vital signs: Vitals:   11/23/22 0212 11/23/22 0227  BP:  111/64  Pulse: 78 92  Resp: 16 16  Temp: 97.8 F (36.6 C)   SpO2: 96% 95%    General: Awake, no distress.  Sitting upright on the edge of the bed and  clearly uncomfortable.  Bracing her right side CV:  Good peripheral perfusion.  Resp:  Normal effort.  Abd:  No distention.  Soft and benign MSK:  No deformity noted.  Sutures to the ulnar aspect of the right elbow are intact with healing granulation tissue without overlying infectious features. Exquisitely tender to the right lateral rib cage Neuro:  No focal deficits appreciated. Other:     ED Results / Procedures / Treatments   Labs (all labs ordered are listed, but only abnormal results are displayed) Labs Reviewed - No data to display  EKG   RADIOLOGY CXR interpreted by me without evidence of acute cardiopulmonary pathology. CT chest interpreted by me with multiple right-sided rib fractures without pneumothorax.  Official radiology report(s): CT Chest Wo Contrast  Result Date: 11/23/2022 CLINICAL DATA:  Fall several days ago. Evaluate for rib fractures and pneumothorax. EXAM: CT CHEST WITHOUT CONTRAST TECHNIQUE: Multidetector CT imaging of the chest was performed following the standard protocol without IV contrast. RADIATION DOSE REDUCTION: This exam was performed according to the departmental dose-optimization program which includes  automated exposure control, adjustment of the mA and/or kV according to patient size and/or use of iterative reconstruction technique. COMPARISON:  None Available. FINDINGS: Cardiovascular: Heart is normal size. Aorta is normal caliber. Scattered coronary artery and aortic calcifications. Mediastinum/Nodes: No mediastinal, hilar, or axillary adenopathy. Trachea and esophagus are unremarkable. Thyroid unremarkable. Lungs/Pleura: Bibasilar atelectasis.  No effusions or pneumothorax. Upper Abdomen: No acute findings. Musculoskeletal: Bilateral breast implants, grossly unremarkable. Chest wall soft tissues are unremarkable. Nondisplaced anterolateral right rib fractures involving the 3rd through 7th ribs. Old healed posterolateral 5th rib fracture. IMPRESSION:  Acute nondisplaced right anterolateral 3rd through 7th rib fractures. No associated pneumothorax or effusion. Bibasilar atelectasis. Scattered coronary artery calcifications. Aortic Atherosclerosis (ICD10-I70.0). Electronically Signed   By: Charlett Nose M.D.   On: 11/23/2022 00:56   DG Chest 2 View  Result Date: 11/22/2022 CLINICAL DATA:  Right rib pain after fall. EXAM: CHEST - 2 VIEW COMPARISON:  12/11/2020 FINDINGS: Heart and mediastinal contours within normal limits. Linear densities in the lung bases compatible with atelectasis or scarring. No effusions or pneumothorax. No visible displaced acute rib fracture. Old healed right 5th rib fracture. IMPRESSION: Bibasilar atelectasis or scarring. No visible acute rib fracture. Electronically Signed   By: Charlett Nose M.D.   On: 11/22/2022 23:09    PROCEDURES and INTERVENTIONS:  Procedures  Medications  lidocaine (LIDODERM) 5 % 1 patch (1 patch Transdermal Patch Applied 11/22/22 2356)  ketorolac (TORADOL) 30 MG/ML injection 30 mg (30 mg Intramuscular Given 11/22/22 2356)  acetaminophen (TYLENOL) tablet 1,000 mg (1,000 mg Oral Given 11/22/22 2356)  fentaNYL (SUBLIMAZE) injection 100 mcg (100 mcg Intramuscular Given 11/22/22 2357)  oxyCODONE (Oxy IR/ROXICODONE) immediate release tablet 5 mg (5 mg Oral Given 11/23/22 0230)     IMPRESSION / MDM / ASSESSMENT AND PLAN / ED COURSE  I reviewed the triage vital signs and the nursing notes.  Differential diagnosis includes, but is not limited to, rib fractures, pneumothorax, MSK pain, liver lac  {Patient presents with symptoms of an acute illness or injury that is potentially life-threatening.  Patient presents with continued pain after a fall that occurred 4 days ago with evidence of multiple right-sided rib fractures.  As below, we have multiple discussions regarding disposition and I recommended admission for pain control and she is agreeable in theory but has work-related logistics that she has to manage  before she is able to be admitted.  She is discharged with multimodal analgesia and we discussed return precautions, expectant management, symptom spirometry  Clinical Course as of 11/23/22 0340  Sun Nov 23, 2022  0108 Reassessed and discussed CT results, rib fractures, pain control and disposition.  She is leaning towards admission to the hospital for pain control, but needs to deal with her ongoing work.  She is a Science writer for [employer]?  And has a work phone that she needs to handoff to someone else and is trying to figure out how to manage this tonight.  She asked me to come back after making some phone calls [DS]  0214 Reassessed multiple times, apparently patient feels the need to go home to logistically deal with her workplace, work phone and ensuring continuity of work provided for her employer.  We discussed close return precautions, pain management at home [DS]    Clinical Course User Index [DS] Delton Prairie, MD     FINAL CLINICAL IMPRESSION(S) / ED DIAGNOSES   Final diagnoses:  Closed fracture of multiple ribs of right side, initial encounter  Rx / DC Orders   ED Discharge Orders          Ordered    lidocaine (LIDODERM) 5 %  Every 12 hours,   Status:  Discontinued        11/23/22 0216    oxyCODONE (ROXICODONE) 5 MG immediate release tablet  Every 8 hours PRN,   Status:  Discontinued        11/23/22 0216    lidocaine (LIDODERM) 5 %  Every 12 hours        11/23/22 0218    oxyCODONE (ROXICODONE) 5 MG immediate release tablet  Every 8 hours PRN        11/23/22 0218             Note:  This document was prepared using Dragon voice recognition software and may include unintentional dictation errors.   Delton Prairie, MD 11/23/22 [id number]

## 2022-11-23 ENCOUNTER — Other Ambulatory Visit: Payer: Self-pay

## 2022-11-23 ENCOUNTER — Emergency Department: Payer: Medicaid Other

## 2022-11-23 DIAGNOSIS — J9811 Atelectasis: Secondary | ICD-10-CM | POA: Diagnosis not present

## 2022-11-23 DIAGNOSIS — I700 Atherosclerosis of aorta: Secondary | ICD-10-CM | POA: Diagnosis not present

## 2022-11-23 MED ORDER — OXYCODONE HCL 5 MG PO TABS: 5.0000 mg | ORAL_TABLET | Freq: Three times a day (TID) | ORAL | 0 refills | Status: DC | PRN

## 2022-11-23 MED ORDER — LIDOCAINE 5 % EX PTCH
1.0000 | MEDICATED_PATCH | Freq: Two times a day (BID) | CUTANEOUS | 0 refills | Status: DC
  Filled 2022-11-23: qty 10, 5d supply, fill #0

## 2022-11-23 MED ORDER — OXYCODONE HCL 5 MG PO TABS
5.0000 mg | ORAL_TABLET | Freq: Three times a day (TID) | ORAL | 0 refills | Status: DC | PRN
  Filled 2022-11-23: qty 20, 7d supply, fill #0

## 2022-11-23 MED ORDER — LIDOCAINE 5 % EX PTCH: 1.0000 | MEDICATED_PATCH | Freq: Two times a day (BID) | CUTANEOUS | 0 refills | Status: DC

## 2022-11-23 MED ORDER — OXYCODONE HCL 5 MG PO TABS
5.0000 mg | ORAL_TABLET | Freq: Once | ORAL | Status: AC
  Administered 2022-11-23: 5 mg via ORAL
  Filled 2022-11-23: qty 1

## 2022-11-23 NOTE — Discharge Instructions (Addendum)
Please take Tylenol and ibuprofen/Advil for your pain.  It is safe to take them together, or to alternate them every few hours.  Take up to 1000mg  of Tylenol at a time, up to 4 times per day.  Do not take more than 4000 mg of Tylenol in 24 hours.  For ibuprofen, take 400-600 mg, 3 - 4 times per day.  Please use lidocaine patches at your site of pain.  Apply 1 patch at a time, leave on for 12 hours, then remove for 12 hours.  12 hours on, 12 hours off.  Do not apply more than 1 patch at a time.  Oxycodone 5mg   2-4 times per day as needed for more severe pain.

## 2022-11-25 ENCOUNTER — Other Ambulatory Visit: Payer: Self-pay

## 2022-11-25 ENCOUNTER — Emergency Department: Payer: Medicaid Other

## 2022-11-25 ENCOUNTER — Inpatient Hospital Stay
Admission: EM | Admit: 2022-11-25 | Discharge: 2022-11-27 | DRG: 184 | Disposition: A | Discharge status: Home / Self Care | Payer: Medicaid Other | Attending: Osteopathic Medicine | Admitting: Osteopathic Medicine

## 2022-11-25 DIAGNOSIS — F17210 Nicotine dependence, cigarettes, uncomplicated: Secondary | ICD-10-CM | POA: Diagnosis present

## 2022-11-25 DIAGNOSIS — E785 Hyperlipidemia, unspecified: Secondary | ICD-10-CM | POA: Diagnosis present

## 2022-11-25 DIAGNOSIS — Z888 Allergy status to other drugs, medicaments and biological substances status: Secondary | ICD-10-CM

## 2022-11-25 DIAGNOSIS — W1830XA Fall on same level, unspecified, initial encounter: Secondary | ICD-10-CM | POA: Diagnosis present

## 2022-11-25 DIAGNOSIS — Z882 Allergy status to sulfonamides status: Secondary | ICD-10-CM

## 2022-11-25 DIAGNOSIS — I252 Old myocardial infarction: Secondary | ICD-10-CM

## 2022-11-25 DIAGNOSIS — R52 Pain, unspecified: Secondary | ICD-10-CM | POA: Diagnosis not present

## 2022-11-25 DIAGNOSIS — I2510 Atherosclerotic heart disease of native coronary artery without angina pectoris: Secondary | ICD-10-CM | POA: Diagnosis present

## 2022-11-25 DIAGNOSIS — Z79890 Hormone replacement therapy (postmenopausal): Secondary | ICD-10-CM

## 2022-11-25 DIAGNOSIS — I420 Dilated cardiomyopathy: Secondary | ICD-10-CM | POA: Diagnosis present

## 2022-11-25 DIAGNOSIS — E039 Hypothyroidism, unspecified: Secondary | ICD-10-CM | POA: Diagnosis present

## 2022-11-25 DIAGNOSIS — I5022 Chronic systolic (congestive) heart failure: Secondary | ICD-10-CM | POA: Diagnosis present

## 2022-11-25 DIAGNOSIS — Z720 Tobacco use: Secondary | ICD-10-CM | POA: Diagnosis present

## 2022-11-25 DIAGNOSIS — J9811 Atelectasis: Secondary | ICD-10-CM | POA: Diagnosis present

## 2022-11-25 DIAGNOSIS — Z79899 Other long term (current) drug therapy: Secondary | ICD-10-CM

## 2022-11-25 DIAGNOSIS — J189 Pneumonia, unspecified organism: Secondary | ICD-10-CM | POA: Diagnosis not present

## 2022-11-25 DIAGNOSIS — Z7982 Long term (current) use of aspirin: Secondary | ICD-10-CM

## 2022-11-25 DIAGNOSIS — S2249XA Multiple fractures of ribs, unspecified side, initial encounter for closed fracture: Secondary | ICD-10-CM

## 2022-11-25 DIAGNOSIS — I110 Hypertensive heart disease with heart failure: Secondary | ICD-10-CM | POA: Diagnosis present

## 2022-11-25 DIAGNOSIS — Z8249 Family history of ischemic heart disease and other diseases of the circulatory system: Secondary | ICD-10-CM

## 2022-11-25 DIAGNOSIS — S2241XA Multiple fractures of ribs, right side, initial encounter for closed fracture: Principal | ICD-10-CM | POA: Diagnosis present

## 2022-11-25 DIAGNOSIS — Z66 Do not resuscitate: Secondary | ICD-10-CM | POA: Diagnosis present

## 2022-11-25 DIAGNOSIS — M7989 Other specified soft tissue disorders: Secondary | ICD-10-CM | POA: Insufficient documentation

## 2022-11-25 DIAGNOSIS — R0781 Pleurodynia: Secondary | ICD-10-CM | POA: Diagnosis present

## 2022-11-25 LAB — CBC WITH DIFFERENTIAL/PLATELET
Abs Immature Granulocytes: 0.01 10*3/uL (ref 0.00–0.07)
Basophils Absolute: 0 10*3/uL (ref 0.0–0.1)
Basophils Relative: 1 %
Eosinophils Absolute: 0.2 10*3/uL (ref 0.0–0.5)
Eosinophils Relative: 3 %
HCT: 38 % (ref 36.0–46.0)
Hemoglobin: 12.4 g/dL (ref 12.0–15.0)
Immature Granulocytes: 0 %
Lymphocytes Relative: 31 %
Lymphs Abs: 2 10*3/uL (ref 0.7–4.0)
MCH: 30.8 pg (ref 26.0–34.0)
MCHC: 32.6 g/dL (ref 30.0–36.0)
MCV: 94.5 fL (ref 80.0–100.0)
Monocytes Absolute: 0.7 10*3/uL (ref 0.1–1.0)
Monocytes Relative: 11 %
Neutro Abs: 3.5 10*3/uL (ref 1.7–7.7)
Neutrophils Relative %: 54 %
Platelets: 282 10*3/uL (ref 150–400)
RBC: 4.02 MIL/uL (ref 3.87–5.11)
RDW: 12.5 % (ref 11.5–15.5)
WBC: 6.4 10*3/uL (ref 4.0–10.5)
nRBC: 0 % (ref 0.0–0.2)

## 2022-11-25 LAB — BASIC METABOLIC PANEL
Anion gap: 8 (ref 5–15)
BUN: 20 mg/dL (ref 6–20)
CO2: 25 mmol/L (ref 22–32)
Calcium: 8.8 mg/dL — ABNORMAL LOW (ref 8.9–10.3)
Chloride: 105 mmol/L (ref 98–111)
Creatinine, Ser: 0.84 mg/dL (ref 0.44–1.00)
GFR, Estimated: >60 mL/min (ref >60)
Glucose, Bld: 90 mg/dL (ref 70–99)
Potassium: 4.5 mmol/L (ref 3.5–5.1)
Sodium: 138 mmol/L (ref 135–145)

## 2022-11-25 LAB — TROPONIN I (HIGH SENSITIVITY)
Troponin I (High Sensitivity): 6 ng/L (ref <18)
Troponin I (High Sensitivity): 5 ng/L (ref <18)

## 2022-11-25 LAB — PROCALCITONIN: Procalcitonin: <0.1 ng/mL

## 2022-11-25 LAB — BRAIN NATRIURETIC PEPTIDE: B Natriuretic Peptide: 64.2 pg/mL (ref 0.0–100.0)

## 2022-11-25 MED ORDER — ONDANSETRON HCL 4 MG/2ML IJ SOLN
4.0000 mg | Freq: Once | INTRAMUSCULAR | Status: AC
  Administered 2022-11-25: 4 mg via INTRAVENOUS
  Filled 2022-11-25: qty 2

## 2022-11-25 MED ORDER — HYDROMORPHONE HCL 1 MG/ML IJ SOLN
1.0000 mg | INTRAMUSCULAR | Status: DC | PRN
  Administered 2022-11-26: 1 mg via INTRAVENOUS
  Filled 2022-11-25: qty 1

## 2022-11-25 MED ORDER — SENNOSIDES-DOCUSATE SODIUM 8.6-50 MG PO TABS: 1.0000 | ORAL_TABLET | Freq: Every evening | ORAL | Status: DC | PRN

## 2022-11-25 MED ORDER — LIDOCAINE 5 % EX PTCH
2.0000 | MEDICATED_PATCH | CUTANEOUS | Status: DC
  Administered 2022-11-25: 2 via TRANSDERMAL
  Filled 2022-11-25 (×2): qty 2

## 2022-11-25 MED ORDER — MORPHINE SULFATE (PF) 4 MG/ML IV SOLN
4.0000 mg | Freq: Once | INTRAVENOUS | Status: DC
  Filled 2022-11-25: qty 1

## 2022-11-25 MED ORDER — MORPHINE SULFATE (PF) 4 MG/ML IV SOLN
4.0000 mg | INTRAVENOUS | Status: DC | PRN
  Administered 2022-11-25 – 2022-11-26 (×3): 4 mg via INTRAVENOUS
  Filled 2022-11-25 (×3): qty 1

## 2022-11-25 MED ORDER — LEVOTHYROXINE SODIUM 100 MCG PO TABS
100.0000 ug | ORAL_TABLET | Freq: Every day | ORAL | Status: DC
  Administered 2022-11-26 – 2022-11-27 (×2): 100 ug via ORAL
  Filled 2022-11-25 (×2): qty 1

## 2022-11-25 MED ORDER — CARVEDILOL 6.25 MG PO TABS
3.1250 mg | ORAL_TABLET | Freq: Two times a day (BID) | ORAL | Status: DC
  Administered 2022-11-26 – 2022-11-27 (×3): 3.125 mg via ORAL
  Filled 2022-11-25 (×3): qty 1

## 2022-11-25 MED ORDER — ONDANSETRON HCL 4 MG/2ML IJ SOLN: 4.0000 mg | Freq: Four times a day (QID) | INTRAMUSCULAR | Status: DC | PRN

## 2022-11-25 MED ORDER — ASPIRIN 81 MG PO CHEW
81.0000 mg | CHEWABLE_TABLET | Freq: Every day | ORAL | Status: DC
  Administered 2022-11-26 – 2022-11-27 (×2): 81 mg via ORAL
  Filled 2022-11-25 (×2): qty 1

## 2022-11-25 MED ORDER — FUROSEMIDE 20 MG PO TABS
20.0000 mg | ORAL_TABLET | Freq: Every day | ORAL | Status: AC
  Filled 2022-11-25: qty 1

## 2022-11-25 MED ORDER — SODIUM CHLORIDE 0.9 % IV SOLN
500.0000 mg | Freq: Once | INTRAVENOUS | Status: AC
  Administered 2022-11-25: 500 mg via INTRAVENOUS
  Filled 2022-11-25: qty 5

## 2022-11-25 MED ORDER — ONDANSETRON HCL 4 MG PO TABS: 4.0000 mg | ORAL_TABLET | Freq: Four times a day (QID) | ORAL | Status: DC | PRN

## 2022-11-25 MED ORDER — ACETAMINOPHEN 325 MG PO TABS
650.0000 mg | ORAL_TABLET | Freq: Four times a day (QID) | ORAL | Status: DC | PRN
  Filled 2022-11-25: qty 2

## 2022-11-25 MED ORDER — ACETAMINOPHEN 650 MG RE SUPP: 650.0000 mg | Freq: Four times a day (QID) | RECTAL | Status: DC | PRN

## 2022-11-25 MED ORDER — NITROGLYCERIN 0.4 MG SL SUBL: 0.4000 mg | SUBLINGUAL_TABLET | SUBLINGUAL | Status: DC | PRN

## 2022-11-25 MED ORDER — HYDROMORPHONE HCL 1 MG/ML IJ SOLN
1.0000 mg | Freq: Once | INTRAMUSCULAR | Status: AC
  Administered 2022-11-25: 1 mg via INTRAVENOUS
  Filled 2022-11-25: qty 1

## 2022-11-25 MED ORDER — ENOXAPARIN SODIUM 40 MG/0.4ML IJ SOSY
40.0000 mg | PREFILLED_SYRINGE | INTRAMUSCULAR | Status: DC
  Administered 2022-11-25 – 2022-11-26 (×2): 40 mg via SUBCUTANEOUS
  Filled 2022-11-25 (×2): qty 0.4

## 2022-11-25 MED ORDER — ATORVASTATIN CALCIUM 20 MG PO TABS
40.0000 mg | ORAL_TABLET | Freq: Every day | ORAL | Status: DC
  Administered 2022-11-26: 40 mg via ORAL
  Filled 2022-11-25: qty 2

## 2022-11-25 MED ORDER — SACUBITRIL-VALSARTAN 24-26 MG PO TABS
1.0000 | ORAL_TABLET | Freq: Two times a day (BID) | ORAL | Status: DC
  Administered 2022-11-26 – 2022-11-27 (×3): 1 via ORAL
  Filled 2022-11-25 (×3): qty 1

## 2022-11-25 MED ORDER — METHOCARBAMOL 1000 MG/10ML IJ SOLN
1000.0000 mg | Freq: Once | INTRAMUSCULAR | Status: AC
  Administered 2022-11-25: 1000 mg via INTRAVENOUS
  Filled 2022-11-25: qty 10

## 2022-11-25 MED ORDER — MORPHINE SULFATE (PF) 4 MG/ML IV SOLN
4.0000 mg | Freq: Once | INTRAVENOUS | Status: AC
  Administered 2022-11-25: 4 mg via INTRAVENOUS
  Filled 2022-11-25: qty 1

## 2022-11-25 NOTE — Assessment & Plan Note (Signed)
Home levothyroxine resumed on admission

## 2022-11-25 NOTE — Assessment & Plan Note (Addendum)
Presumed secondary to multiple rib fracture As needed acetaminophen 650 mg p.o. every 6 hours as needed for mild pain, fever, headache; morphine 4 mg IV every 4 hours as needed for moderate pain, 15 hours of coverage ordered; Dilaudid 1 mg IV every 4 hours as needed for severe pain, 15 hours of coverage ordered Discussed with with patient that, the goal of pain control is to reduce the pain by 50%.  It is NOT the goal of inpatient pain control to make the pain 0 as this can increase patient risk of having respiratory distress and then failure, overdose, cardiac arrest. Encourage incentive spirometry and flutter valve use extensively at bedside to keep her lungs open AM team to reevaluate patient at bedside for continued IV pain medication requirements

## 2022-11-25 NOTE — Assessment & Plan Note (Signed)
Home atorvastatin nightly resumed Of note patient states that she took her home atorvastatin while she was in the ED (her home supply)

## 2022-11-25 NOTE — ED Triage Notes (Signed)
Pt presents to ED with c/o of a fall recently and states she has been seen and states she was told she had 5 broken ribs on the R side. Pt states they suggested she be admitted for pain control and "to watch her breathing". Pt states she could not be admitted due to work. Pt also states bilateral feet swelling that is new, pt states HX of CHF but does not take meds for it.

## 2022-11-25 NOTE — ED Provider Notes (Signed)
[facility] Emergency Department Provider Note     Event Date/Time   First MD Initiated Contact with Patient 11/25/22 1606     (approximate)   History   Rib Injury   HPI  Gabrielle Riley is a 57 y.o. female returns to the ED for evaluation of ongoing chest wall pain following a mechanical fall on 7/31.  Patient with confirmed 3rd through 7th rib fractures; with a otherwise stable chest CT 2 days ago.  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  She returns today noting ongoing and increasing chest wall pain and generalized disability due to pain.  She also is endorsing some bilateral lower extremity edema from the feet to the shins.  She does give a history of CAD, STEMI, and CHF.  She denies any cough, congestion, hemoptysis, or shortness of breath patient denies any fevers, chills, or sweats.  Patient at this time is requesting admission for ongoing pain control and management of her new BLE.  Physical Exam   Triage Vital Signs: ED Triage Vitals  Encounter Vitals Group     BP 11/25/22 1331 137/73     Systolic BP Percentile --      Diastolic BP Percentile --      Pulse Rate 11/25/22 1331 77     Resp 11/25/22 1331 17     Temp 11/25/22 1331 98 F (36.7 C)     Temp Source 11/25/22 1331 Oral     SpO2 11/25/22 1331 96 %     Weight 11/25/22 1332 149 lb 14.6 oz (68 kg)     Height 11/25/22 1332 5\' 1"  (1.549 m)     Head Circumference --      Peak Flow --      Pain Score 11/25/22 1332 10     Pain Loc --      Pain Education --      Exclude from Growth Chart --     Most recent vital signs: Vitals:   11/25/22 1816 11/25/22 2137  BP: 130/70 120/65  Pulse: 70 63  Resp: 16 18  Temp: 98 F (36.7 C) 97.6 F (36.4 C)  SpO2: 96%      General Awake, no distress. NAD HEENT NCAT. PERRL. EOMI. No rhinorrhea. Mucous membranes are moist.  CV:  Good peripheral perfusion. RRR RESP:  Normal effort. CTA ABD:  No distention.    ED Results / Procedures / Treatments   Labs (all labs ordered are listed, but only abnormal results are displayed) Labs Reviewed  BASIC METABOLIC PANEL - Abnormal; Notable for the following components:      Result Value   Calcium 8.8 (*)    All other components within normal limits  CBC WITH DIFFERENTIAL/PLATELET  BRAIN NATRIURETIC PEPTIDE  PROCALCITONIN  BASIC METABOLIC PANEL  CBC  TROPONIN I (HIGH SENSITIVITY)  TROPONIN I (HIGH SENSITIVITY)    EKG  Vent. rate 68 BPM PR interval 174 ms QRS duration 86 ms QT/QTcB 418/444 ms P-R-T axes 52 65 -30 Sinus rhythm with occasional Premature ventricular complexes Low voltage QRS Nonspecific T wave abnormality Abnormal ECG No STEMI  RADIOLOGY  I personally viewed and evaluated these images as part of my medical decision making, as well as reviewing the written report by the radiologist.  ED Provider Interpretation: Possible superimposed bilateral PNA.  DG Chest 2 View  Result Date: 11/25/2022 CLINICAL DATA:  rib pain EXAM: CHEST - 2 VIEW COMPARISON:  CT scan chest from 11/23/2022. FINDINGS: There are patchy atelectatic changes at bilateral lung bases, left more than right. Correlate clinically for superimposed pneumonia. Bilateral lung fields are otherwise clear. Bilateral costophrenic angles are clear. Stable cardio-mediastinal silhouette. No acute osseous abnormalities. Multiple acute rib fractures seen on recent chest CT scan are not well visualized on this exam. Old healed fracture of posterolateral right fifth rib noted. The soft tissues are within normal limits. IMPRESSION: *Patchy atelectatic changes at bilateral lung bases. *Please refer to recent CT scan chest from 11/23/2022 for details. Electronically Signed   By: Jules Schick  M.D.   On: 11/25/2022 14:43    CT Chest w/o CM (11/23/22)  IMPRESSION: Acute nondisplaced right anterolateral 3rd through 7th rib fractures. No associated pneumothorax or effusion.   Bibasilar atelectasis.   Scattered coronary artery calcifications.   Aortic Atherosclerosis (ICD10-I70.0). PROCEDURES:  Critical Care performed: No  Procedures   MEDICATIONS ORDERED IN ED: Medications  morphine (PF) 4 MG/ML injection 4 mg (4 mg Intravenous Not Given 11/25/22 1722)  acetaminophen (TYLENOL) tablet 650 mg (has no administration in time range)    Or  acetaminophen (TYLENOL) suppository 650 mg (has no administration in time range)  ondansetron (ZOFRAN) tablet 4 mg (has no administration in time range)    Or  ondansetron (ZOFRAN) injection 4 mg (has no administration in time range)  enoxaparin (LOVENOX) injection 40 mg (40 mg Subcutaneous Given 11/25/22 2310)  senna-docusate (Senokot-S) tablet 1 tablet (has no administration in time range)  furosemide (LASIX) tablet 20 mg (has no administration in time range)  morphine (PF) 4 MG/ML injection 4 mg (4 mg Intravenous Given 11/25/22 2310)  HYDROmorphone (DILAUDID) injection 1 mg (has no administration in time range)  lidocaine (LIDODERM) 5 % 2 patch (2 patches Transdermal Patch Applied 11/25/22 2323)  aspirin chewable tablet 81 mg (has no administration in time range)  atorvastatin (LIPITOR) tablet 40 mg (has no administration in time range)  carvedilol (COREG) tablet 3.125 mg (has no administration in time range)  nitroGLYCERIN (NITROSTAT) SL tablet 0.4 mg (has no administration in time range)  sacubitril-valsartan (ENTRESTO) 24-26 mg per tablet (has no administration in time range)  levothyroxine (SYNTHROID) tablet 100 mcg (has no administration in time range)  ondansetron (ZOFRAN) injection 4 mg (4 mg Intravenous Given 11/25/22 1717)  morphine (PF) 4 MG/ML injection 4 mg (4 mg Intravenous Given 11/25/22 1717)  azithromycin (ZITHROMAX) 500 mg in  sodium chloride 0.9 % 250 mL IVPB (0 mg Intravenous Stopped 11/25/22 2034)  HYDROmorphone (DILAUDID) injection 1 mg (1 mg Intravenous Given 11/25/22 1923)  methocarbamol (ROBAXIN) injection 1,000 mg (1,000 mg Intravenous Given 11/25/22 1923)     IMPRESSION / MDM / ASSESSMENT AND PLAN / ED COURSE  I reviewed the triage vital signs and the nursing notes.                              Differential diagnosis includes, but is not limited to, Differential diagnosis includes, but is not limited to, ACS, aortic dissection, pulmonary embolism, cardiac tamponade, pneumothorax, pneumonia, pericarditis, myocarditis, GI-related causes including esophagitis/gastritis, and musculoskeletal chest wall pain.     Patient's presentation is most consistent with acute complicated illness / injury requiring diagnostic workup.  Patient's diagnosis is consistent with stable persistent 3rd through  7th rib fractures on the right with ongoing intractable pain.  Patient presents to the ED for evaluation and found to have concerns for possible superimposed pneumonia.  Patient will be admitted to the hospital service for ongoing evaluation management of her pain and CAP.  Patient otherwise with a reassuring exam and workup at this time with no acute lab abnormalities noted.    FINAL CLINICAL IMPRESSION(S) / ED DIAGNOSES   Final diagnoses:  Community acquired pneumonia, unspecified laterality  Multiple rib fractures involving four or more ribs     Rx / DC Orders   ED Discharge Orders     None        Note:  This document was prepared using Dragon voice recognition software and may include unintentional dictation errors.    Lissa Hoard, PA-C 11/26/22 0024    Willy Eddy, MD 11/27/22 1059

## 2022-11-25 NOTE — H&P (Addendum)
History and Physical   Gabrielle Riley MVH:[id number] DOB: [birth year] DOA: 11/25/2022  PCP: [facility]  Outpatient Specialists: [facility] cardiology, Dr. Okey Dupre Patient coming from: Home  I have personally briefly reviewed patient's old medical records in [facility] EMR.  Chief Concern: Home  HPI: Ms. Gabrielle Riley is a 57 year old female with history of heart failure reduced ejection fraction, hypothyroid, hypertension, hyperlipidemia, who presents emergency department for chief concerns of rib pain and swelling of the lower extremities.  Of note, patient was seen in the emergency department on 8/3 for mechanical fall with right rib cage pain.  Patient was found to have multiple rib fractures, and was given IV pain medication and was advised to be admitted to the hospital for monitoring given IV pain medications.  Patient declined at that time stating that she has to go to work.  She returns for the same pain in that area along with swelling of her lower extremities.  Vitals in the ED showed temperature of 98, respiration rate of 16, heart rate of 70, blood pressure 130/70, SpO2 of 96% on room air.  Serum sodium is 138, potassium 4.5, chloride 105, bicarb 25, BUN of 20, serum creatinine 0.84, EGFR greater than 60, nonfasting blood glucose of 90, WBC 6.4, hemoglobin 12.4, platelets of 282.  BNP was not elevated at 64.2, high since he troponin was 6.  Chest x-ray 2 view: Stated to refer to CT scan for further details.  Patchy atelectasis changes in bilateral lung base.  ED treatment: Morphine 4 mg IV one-time dose, ondansetron 4 mg IV one-time dose, Robaxin 1000 mg IV one-time dose, Dilaudid 1 mg IV one-time dose.  Azithromycin 500 mg IV one-time dose. ------------------------------ At bedside, patient is awake alert and oriented x 3.  She reports that it was a mechanical fall on 8/3.  She denies anybody hitting her or pushing her.  She reports that the oxycodone sent home with her  on 8/4 felt like she was only taking candy and it did not help reduce her pain as well.  She denies fever, nausea, vomiting, otherwise chest pain.  She endorses that since she had the rib fracture, she developed swelling of her lower extremities and this scared her.  She reports that today she woke up and both her legs were painful, tight, and very swollen.  She reports that the swelling has reduced significantly since being in the emergency department.  Of note: I did not note any swelling of her lower extremities or pitting edema.  She reports that it has improved significantly since this morning.  Social history: Patient lives at home.  ROS: Constitutional: no weight change, no fever ENT/Mouth: no sore throat, no rhinorrhea Eyes: no eye pain, no vision changes Cardiovascular: no chest pain, no dyspnea,  no edema, no palpitations Respiratory: no cough, no sputum, no wheezing Gastrointestinal: no nausea, no vomiting, no diarrhea, no constipation Genitourinary: no urinary incontinence, no dysuria, no hematuria Musculoskeletal: no arthralgias, no myalgias Skin: no skin lesions, no pruritus, Neuro: + weakness, no loss of consciousness, no syncope Psych: no anxiety, no depression, + decrease appetite Heme/Lymph: no bruising, no bleeding  ED Course: Discussed with emergency medicine provider, patient requiring hospitalization for chief concerns of pain control.  Assessment/Plan  Principal Problem:   Inadequate pain control Active Problems:   Dilated cardiomyopathy (HCC)   Chronic HFrEF (heart failure with reduced ejection fraction) (HCC)   Tobacco abuse   CAD (coronary artery disease)   Hypothyroidism   Leg  swelling   Assessment and Plan:  * Inadequate pain control Presumed secondary to multiple rib fracture As needed acetaminophen 650 mg p.o. every 6 hours as needed for mild pain, fever, headache; morphine 4 mg IV every 4 hours as needed for moderate pain, 15 hours of coverage  ordered; Dilaudid 1 mg IV every 4 hours as needed for severe pain, 15 hours of coverage ordered Discussed with with patient that, the goal of pain control is to reduce the pain by 50%.  It is NOT the goal of inpatient pain control to make the pain 0 as this can increase patient risk of having respiratory distress and then failure, overdose, cardiac arrest. Encourage incentive spirometry and flutter valve use extensively at bedside to keep her lungs open AM team to reevaluate patient at bedside for continued IV pain medication requirements  Leg swelling Bilateral, with increased pain and this is new for the patient She reports that she has never had leg swelling from heart failure before Patient states she is not taking home Lasix as needed because she states that she has never had swelling from her legs from heart failure and she did not think that the leg swelling was due to heart failure Home dose of furosemide 20 mg p.o. one-time daily ordered Ultrasound of the lower extremities ordered to assess for DVT  Hypothyroidism Home levothyroxine resumed on admission  CAD (coronary artery disease) Home atorvastatin nightly resumed Of note patient states that she took her home atorvastatin while she was in the ED (her home supply)  Chronic HFrEF (heart failure with reduced ejection fraction) (HCC) Patient reports that she does have her home Coreg, Entresto, which she brought with her and has taken (evening dose) since being in the hospital. Home Coreg and Entresto resumed on admission  Chart reviewed.   Per outpatient cardiology/heart failure note on 11/06/2022: Patient is prescribed furosemide 20 mg daily as needed, carvedilol 3.125 mg p.o. twice daily, Entresto 24/26 mg p.o. twice daily  DVT prophylaxis: Enoxaparin Code Status: DNR, confirmed with patient at bedside.  She states that she has no one to live for. Diet: Heart healthy Family Communication: No Disposition Plan: Pending clinical  course Consults called: None at this time Admission status: Telemetry medical, observation  Past Medical History:  Diagnosis Date   Acute ST elevation myocardial infarction (STEMI) of inferolateral wall (HCC)    a. 11/2020 STEMI 2/2 SCAD involving OM2-->Med rx.   CAD (coronary artery disease)    a. 11/2020 STEMI/Cath: LM nl, LAD 33m, LCX nl, OM2 75/100 (SCAD), RCA 15p-->Med Rx.   FHx: rheumatic fever    Hyperlipidemia LDL goal <70    Hypothyroidism    Spontaneous dissection of coronary artery    a. 11/2020 ->OM2 75/100-->Med rx; b. 01/2021 Renal Duplex: No RAS or evidence of FMD. Nl Celiac/SMA/IMA.   Stress-induced cardiomyopathy    a. 11/2020 Echo: EF 45-50%, apical HK w/ hypercontractile LV basal segments sugg of stress-induced CM. Gr1 DD, nl RV fxn; b. 01/2021 Echo: EF 45-50%, sev mid-dist peri-apical/apical HK. Basal wall well preserved. Nl RV fxn.   Tobacco abuse    Past Surgical History:  Procedure Laterality Date   CARDIAC CATHETERIZATION     ECTOPIC PREGNANCY SURGERY     LEFT HEART CATH AND CORONARY ANGIOGRAPHY N/A 12/11/2020   Procedure: LEFT HEART CATH AND CORONARY ANGIOGRAPHY;  Surgeon: Yvonne Kendall, MD;  Location: [facility] INVASIVE CV LAB;  Service: Cardiovascular;  Laterality: N/A;   Social History:  reports that she has  been smoking cigarettes. She has a 12.5 pack-year smoking history. She has never used smokeless tobacco. She reports that she does not currently use alcohol. She reports that she does not currently use drugs.  Allergies  Allergen Reactions   Imitrex [Sumatriptan]     Throat closing   Sulfa Antibiotics    Family History  Problem Relation Age of Onset   Diabetes Sister    Heart attack Brother    Family history: Family history reviewed and not pertinent  Prior to Admission medications   Medication Sig Start Date End Date Taking? Authorizing Provider  aspirin 81 MG chewable tablet Chew 1 tablet (81 mg total) by mouth once daily. 05/28/21   Delma Freeze, FNP  atorvastatin (LIPITOR) 40 MG tablet Take 1 tablet (40 mg total) by mouth daily. 09/30/22 12/29/22  Creig Hines, NP  carvedilol (COREG) 3.125 MG tablet TAKE 1 TABLET BY MOUTH TWICE A DAY WITH FOOD 11/13/22   [name] A, FNP  furosemide (LASIX) 20 MG tablet Take 1 tablet (20 mg total) by mouth once daily. Patient taking differently: Take 20 mg by mouth daily. As needed 05/28/21   Delma Freeze, FNP  levothyroxine (SYNTHROID) 100 MCG tablet Take 100 mcg by mouth daily before breakfast.    [provider]  lidocaine (LIDODERM) 5 % Place 1 patch onto the skin every 12 (twelve) hours. Remove & Discard patch within 12 hours or as directed by MD 11/23/22 11/23/23  Delton Prairie, MD  Multiple Vitamins-Minerals (CENTRUM SILVER 50+WOMEN PO) Take 1 tablet by mouth daily.    [provider]  nitroGLYCERIN (NITROSTAT) 0.4 MG SL tablet Place 1 tablet (0.4 mg total) under the tongue every 5 (five) minutes as needed for chest pain. 08/21/22 11/19/22  Creig Hines, NP  oxyCODONE (ROXICODONE) 5 MG immediate release tablet Take 1 tablet (5 mg total) by mouth every 8 (eight) hours as needed. 11/23/22 11/23/23  Delton Prairie, MD  sacubitril-valsartan (ENTRESTO) 24-26 MG Take 1 tablet by mouth 2 (two) times daily. 08/21/22   Creig Hines, NP   Physical Exam: Vitals:   11/25/22 1331 11/25/22 1332 11/25/22 1816 11/25/22 2137  BP: 137/73  130/70 120/65  Pulse: 77  70 63  Resp: 17  16 18   Temp: 98 F (36.7 C)  98 F (36.7 C) 97.6 F (36.4 C)  TempSrc: Oral  Oral Oral  SpO2: 96%  96%   Weight:  68 kg    Height:  5\' 1"  (1.549 m)     Constitutional: appears age-appropriate, NAD, calm Eyes: PERRL, lids and conjunctivae normal ENMT: Mucous membranes are moist. Posterior pharynx clear of any exudate or lesions. Age-appropriate dentition. Hearing appropriate Neck: normal, supple, no masses, no thyromegaly Respiratory: clear to auscultation bilaterally, no wheezing, no  crackles. Normal respiratory effort. No accessory muscle use.  Cardiovascular: Regular rate and rhythm, no murmurs / rubs / gallops. No extremity edema. 2+ pedal pulses. No carotid bruits.  Abdomen: no tenderness, no masses palpated, no hepatosplenomegaly. Bowel sounds positive.  Musculoskeletal: no clubbing / cyanosis. No joint deformity upper and lower extremities. Good ROM, no contractures, no atrophy. Normal muscle tone.  Skin: no rashes, lesions, ulcers. No induration Neurologic: Sensation intact. Strength 5/5 in all 4.  Psychiatric: Normal judgment and insight. Alert and oriented x 3. Normal mood.   EKG: independently reviewed, showing sinus rhythm with rate of 68, QTc 444  Chest x-ray on Admission: I personally reviewed and I agree with radiologist reading as  below.  DG Chest 2 View  Result Date: 11/25/2022 CLINICAL DATA:  rib pain EXAM: CHEST - 2 VIEW COMPARISON:  CT scan chest from 11/23/2022. FINDINGS: There are patchy atelectatic changes at bilateral lung bases, left more than right. Correlate clinically for superimposed pneumonia. Bilateral lung fields are otherwise clear. Bilateral costophrenic angles are clear. Stable cardio-mediastinal silhouette. No acute osseous abnormalities. Multiple acute rib fractures seen on recent chest CT scan are not well visualized on this exam. Old healed fracture of posterolateral right fifth rib noted. The soft tissues are within normal limits. IMPRESSION: *Patchy atelectatic changes at bilateral lung bases. *Please refer to recent CT scan chest from 11/23/2022 for details. Electronically Signed   By: Jules Schick M.D.   On: 11/25/2022 14:43    Labs on Admission: I have personally reviewed following labs  CBC: Recent Labs  Lab 11/25/22 1714  WBC 6.4  NEUTROABS 3.5  HGB 12.4  HCT 38.0  MCV 94.5  PLT 282   Basic Metabolic Panel: Recent Labs  Lab 11/25/22 1832  NA 138  K 4.5  CL 105  CO2 25  GLUCOSE 90  BUN 20  CREATININE 0.84   CALCIUM 8.8*   GFR: Estimated Creatinine Clearance: 64.4 mL/min (by C-G formula based on SCr of 0.84 mg/dL).  This document was prepared using Dragon Voice Recognition software and may include unintentional dictation errors.  Dr. Sedalia Muta [facility]  If 7PM-7AM, please contact overnight-coverage provider If 7AM-7PM, please contact day attending provider [email or web address]  11/25/2022, 11:18 PM

## 2022-11-25 NOTE — Assessment & Plan Note (Signed)
Patient reports that she does have her home Coreg, Sherryll Burger, which she brought with her and has taken (evening dose) since being in the hospital. Home Coreg and Sherryll Burger resumed on admission

## 2022-11-25 NOTE — Assessment & Plan Note (Addendum)
Bilateral, with increased pain and this is new for the patient She reports that she has never had leg swelling from heart failure before Patient states she is not taking home Lasix as needed because she states that she has never had swelling from her legs from heart failure and she did not think that the leg swelling was due to heart failure Home dose of furosemide 20 mg p.o. one-time daily ordered Ultrasound of the lower extremities ordered to assess for DVT

## 2022-11-25 NOTE — Hospital Course (Signed)
Ms. Dorrene Pullman is a 57 year old female with history of heart failure reduced ejection fraction, hypothyroid, hypertension, hyperlipidemia, who presents emergency department for chief concerns of rib pain and swelling of the lower extremities.  Of note, patient was seen in the emergency department on 8/3 for mechanical fall with right rib cage pain.  Patient was found to have multiple rib fractures, and was given IV pain medication and was advised to be admitted to the hospital for monitoring given IV pain medications.  Patient declined at that time stating that she has to go to work.  She returns for the same pain in that area along with swelling of her lower extremities.  Vitals in the ED showed temperature of 98, respiration rate of 16, heart rate of 70, blood pressure 130/70, SpO2 of 96% on room air.  Serum sodium is 138, potassium 4.5, chloride 105, bicarb 25, BUN of 20, serum creatinine 0.84, EGFR greater than 60, nonfasting blood glucose of 90, WBC 6.4, hemoglobin 12.4, platelets of 282.  BNP was not elevated at 64.2, high since he troponin was 6.  Chest x-ray 2 view: Stated to refer to CT scan for further details.  Patchy atelectasis changes in bilateral lung base.  ED treatment: Morphine 4 mg IV one-time dose, ondansetron 4 mg IV one-time dose, Robaxin 1000 mg IV one-time dose, Dilaudid 1 mg IV one-time dose.  Azithromycin 500 mg IV one-time dose.

## 2022-11-26 ENCOUNTER — Observation Stay: Payer: Medicaid Other

## 2022-11-26 DIAGNOSIS — Z7982 Long term (current) use of aspirin: Secondary | ICD-10-CM | POA: Diagnosis not present

## 2022-11-26 DIAGNOSIS — Z8249 Family history of ischemic heart disease and other diseases of the circulatory system: Secondary | ICD-10-CM | POA: Diagnosis not present

## 2022-11-26 DIAGNOSIS — R0781 Pleurodynia: Secondary | ICD-10-CM | POA: Diagnosis not present

## 2022-11-26 DIAGNOSIS — M79604 Pain in right leg: Secondary | ICD-10-CM | POA: Diagnosis not present

## 2022-11-26 DIAGNOSIS — J9811 Atelectasis: Secondary | ICD-10-CM | POA: Diagnosis present

## 2022-11-26 DIAGNOSIS — I5022 Chronic systolic (congestive) heart failure: Secondary | ICD-10-CM | POA: Diagnosis present

## 2022-11-26 DIAGNOSIS — R52 Pain, unspecified: Secondary | ICD-10-CM | POA: Diagnosis not present

## 2022-11-26 DIAGNOSIS — Z66 Do not resuscitate: Secondary | ICD-10-CM | POA: Diagnosis present

## 2022-11-26 DIAGNOSIS — Z79899 Other long term (current) drug therapy: Secondary | ICD-10-CM | POA: Diagnosis not present

## 2022-11-26 DIAGNOSIS — S2241XA Multiple fractures of ribs, right side, initial encounter for closed fracture: Secondary | ICD-10-CM | POA: Diagnosis not present

## 2022-11-26 DIAGNOSIS — Z888 Allergy status to other drugs, medicaments and biological substances status: Secondary | ICD-10-CM | POA: Diagnosis not present

## 2022-11-26 DIAGNOSIS — M79605 Pain in left leg: Secondary | ICD-10-CM | POA: Diagnosis not present

## 2022-11-26 DIAGNOSIS — M7989 Other specified soft tissue disorders: Secondary | ICD-10-CM | POA: Diagnosis not present

## 2022-11-26 DIAGNOSIS — I252 Old myocardial infarction: Secondary | ICD-10-CM | POA: Diagnosis not present

## 2022-11-26 DIAGNOSIS — E785 Hyperlipidemia, unspecified: Secondary | ICD-10-CM | POA: Diagnosis present

## 2022-11-26 DIAGNOSIS — I420 Dilated cardiomyopathy: Secondary | ICD-10-CM | POA: Diagnosis present

## 2022-11-26 DIAGNOSIS — Z882 Allergy status to sulfonamides status: Secondary | ICD-10-CM | POA: Diagnosis not present

## 2022-11-26 DIAGNOSIS — I2510 Atherosclerotic heart disease of native coronary artery without angina pectoris: Secondary | ICD-10-CM | POA: Diagnosis present

## 2022-11-26 DIAGNOSIS — I110 Hypertensive heart disease with heart failure: Secondary | ICD-10-CM | POA: Diagnosis present

## 2022-11-26 DIAGNOSIS — Z79890 Hormone replacement therapy (postmenopausal): Secondary | ICD-10-CM | POA: Diagnosis not present

## 2022-11-26 DIAGNOSIS — E039 Hypothyroidism, unspecified: Secondary | ICD-10-CM | POA: Diagnosis present

## 2022-11-26 DIAGNOSIS — W1830XA Fall on same level, unspecified, initial encounter: Secondary | ICD-10-CM | POA: Diagnosis present

## 2022-11-26 DIAGNOSIS — F17210 Nicotine dependence, cigarettes, uncomplicated: Secondary | ICD-10-CM | POA: Diagnosis present

## 2022-11-26 MED ORDER — NAPROXEN 375 MG PO TABS
375.0000 mg | ORAL_TABLET | Freq: Three times a day (TID) | ORAL | Status: DC
  Administered 2022-11-26 – 2022-11-27 (×2): 375 mg via ORAL
  Filled 2022-11-26 (×4): qty 1

## 2022-11-26 MED ORDER — MORPHINE SULFATE (PF) 2 MG/ML IV SOLN: 2.0000 mg | INTRAVENOUS | Status: DC | PRN

## 2022-11-26 MED ORDER — MORPHINE SULFATE (PF) 4 MG/ML IV SOLN: 4.0000 mg | INTRAVENOUS | Status: DC | PRN

## 2022-11-26 MED ORDER — HYDROCODONE-ACETAMINOPHEN 7.5-325 MG/15ML PO SOLN
10.0000 mL | ORAL | Status: DC | PRN
  Administered 2022-11-26 – 2022-11-27 (×3): 10 mL via ORAL
  Filled 2022-11-26 (×3): qty 15

## 2022-11-26 MED ORDER — FUROSEMIDE 40 MG PO TABS
40.0000 mg | ORAL_TABLET | Freq: Once | ORAL | Status: AC
  Administered 2022-11-26: 40 mg via ORAL
  Filled 2022-11-26: qty 1

## 2022-11-26 MED ORDER — METHOCARBAMOL 500 MG PO TABS
1000.0000 mg | ORAL_TABLET | Freq: Three times a day (TID) | ORAL | Status: DC
  Administered 2022-11-26 – 2022-11-27 (×3): 1000 mg via ORAL
  Filled 2022-11-26 (×3): qty 2

## 2022-11-26 MED ORDER — LIDOCAINE 5 % EX PTCH: 2.0000 | MEDICATED_PATCH | CUTANEOUS | Status: DC

## 2022-11-26 NOTE — Plan of Care (Signed)
  Problem: Education: Goal: Knowledge of General Education information will improve Description: Including pain rating scale, medication(s)/side effects and non-pharmacologic comfort measures Outcome: Progressing   Problem: Clinical Measurements: Goal: Ability to maintain clinical measurements within normal limits will improve Outcome: Progressing   

## 2022-11-26 NOTE — Progress Notes (Signed)
Patient stated that she had taken her home medications for the night. She was educated on the dangers of taking her home medications at the hospital and the medication is secured at the nurses station.  The patient took her Lipitor, Coreg and Entresto.

## 2022-11-26 NOTE — Progress Notes (Signed)
PROGRESS NOTE    Gabrielle Riley   ZOX:[id number] DOB: Sep 11, 1965  DOA: 11/25/2022 Date of Service: 11/26/22 PCP: [facility]     Brief Narrative / Hospital Course:  Ms. Gabrielle Riley is a 57 year old female with history of heart failure reduced ejection fraction, hypothyroid, hypertension, hyperlipidemia, who presents emergency department 08/06 for chief concerns of rib pain and swelling of the lower extremities. Of note, patient was seen in the emergency department on 08/03 for mechanical fall with right rib cage pain.  Patient was found to have multiple rib fractures, and was given IV pain medication and was advised to be admitted to the hospital for monitoring given IV pain medications.  Patient declined at that time stating that she has to go to work. She returned for the same pain in that area along with swelling of her lower extremities. 08/06: to ED. Chest x-ray 2 view: Stated to refer to CT scan for further details.  Patchy atelectasis changes in bilateral lung base. GIven morphine, robaxin, azithromycin. Admitted to hospitalist service for pain control. Of note: admitting hospitalist did not note any swelling of her lower extremities or pitting edema, pt reported that it had improved significantly since earlier that morning.  08/07: weaning off IV pain meds  Consultants:  none  Procedures: none      ASSESSMENT & PLAN:   Principal Problem:   Inadequate pain control Active Problems:   Dilated cardiomyopathy (HCC)   Chronic HFrEF (heart failure with reduced ejection fraction) (HCC)   Tobacco abuse   CAD (coronary artery disease)   Hypothyroidism   Leg swelling  Inadequate pain control Presumed secondary to multiple rib fracture As needed acetaminophen 650 mg p.o. every 6 hours as needed for mild pain, fever, headache; morphine 4 mg IV every 4 hours as needed for moderate pain, 15 hours of coverage ordered; Dilaudid 1 mg IV every 4 hours as needed for severe  pain, 15 hours of coverage ordered Admitting hospitalist discussed with with patient that, the goal of pain control is to reduce the pain by 50%.  It is NOT the goal of inpatient pain control to make the pain 0 as this can increase patient risk of having respiratory distress and then failure, overdose, cardiac arrest. This was reinforced today w/ plan to wean off IV meds and anticipate discharge home  Started po hydrocodone-APAP 7.5-325 mg today q6h prn, naproxen 375 mg tid, methocarbamol 1000 mg tid, lidoderm patches Encourage incentive spirometry and flutter valve use extensively at bedside to keep her lungs open  Leg swelling Bilateral, with increased pain and this is new for the patient She reports that she has never had leg swelling from heart failure before Patient states she is not taking home Lasix as needed because she states that she has never had swelling from her legs from heart failure and she did not think that the leg swelling was due to heart failure Home dose of furosemide 20 mg p.o. one-time daily ordered but pt refused yesterday, discussed w/ her today that her concern for the lasix making her urinate a lot is in my opinion preferable to fluid overload / potential CHF and she agrees to take lasix today 40 mg po x1 but refuses telemetry, tele d/c  Ultrasound of the lower extremities ordered to assess for DVT --> negative   Hypothyroidism Home levothyroxine resumed on admission   CAD (coronary artery disease) Home atorvastatin nightly resumed   Chronic HFrEF (heart failure with reduced ejection fraction) (HCC)  Home Coreg and Entresto resumed on admission Per outpatient cardiology/heart failure note on 11/06/2022: Patient is prescribed furosemide 20 mg daily as needed, carvedilol 3.125 mg p.o. twice daily, Entresto 24/26 mg p.o. twice daily     DVT prophylaxis: lovenox  Pertinent IV fluids/nutrition: no continuous IV fluids Central lines / invasive devices: none  Code  Status: FULL CODE ACP documentation reviewed: 11/26/22 none on file   Current Admission Status: inpatient  TOC needs / Dispo plan: none at this time Barriers to discharge / significant pending items: needs to be off IV pain meds, working on weaning this down              Subjective / Brief ROS:  Patient reports her pain is bad today, she hurts when she moves around,  Concerns about her feet swelling which has gotten bad again today Denies CP/SOB.  Pain controlled.  Denies new weakness.  Tolerating diet but no appetite.  Reports no concerns w/ urination/defecation.   Family Communication: none at this time     Objective Findings:  Vitals:   11/25/22 2137 11/26/22 0445 11/26/22 0818 11/26/22 1007  BP: 120/65 106/76 128/78   Pulse: 63 72 (!) 58 63  Resp: 18 20 16    Temp: 97.6 F (36.4 C) 98 F (36.7 C) 97.9 F (36.6 C)   TempSrc: Oral Oral Oral   SpO2:   100%   Weight:      Height:        Intake/Output Summary (Last 24 hours) at 11/26/2022 1544 Last data filed at 11/25/2022 2034 Gross per 24 hour  Intake 250 ml  Output --  Net 250 ml   Filed Weights   11/25/22 1332  Weight: 68 kg    Examination:  Physical Exam Constitutional:      General: She is not in acute distress.    Appearance: She is normal weight.  Cardiovascular:     Rate and Rhythm: Normal rate and regular rhythm.  Pulmonary:     Effort: Pulmonary effort is normal.     Breath sounds: Normal breath sounds.  Musculoskeletal:     Right lower leg: Edema present.     Left lower leg: Edema present.  Skin:    General: Skin is warm and dry.  Neurological:     General: No focal deficit present.     Mental Status: She is alert and oriented to person, place, and time. Mental status is at baseline.  Psychiatric:        Behavior: Behavior normal.          Scheduled Medications:   aspirin  81 mg Oral Daily   atorvastatin  40 mg Oral QHS   carvedilol  3.125 mg Oral BID WC   enoxaparin  (LOVENOX) injection  40 mg Subcutaneous Q24H   furosemide  20 mg Oral Daily   levothyroxine  100 mcg Oral Q0600   lidocaine  2 patch Transdermal Q24H   methocarbamol  1,000 mg Oral TID    morphine injection  4 mg Intravenous Once   naproxen  375 mg Oral TID WC   sacubitril-valsartan  1 tablet Oral BID    Continuous Infusions:   PRN Medications:  acetaminophen **OR** acetaminophen, HYDROcodone-acetaminophen, morphine injection, nitroGLYCERIN, ondansetron **OR** ondansetron (ZOFRAN) IV, senna-docusate  Antimicrobials from admission:  Anti-infectives (From admission, onward)    Start     Dose/Rate Route Frequency Ordered Stop   11/25/22 1900  azithromycin (ZITHROMAX) 500 mg in sodium chloride 0.9 % 250 mL  IVPB        500 mg 250 mL/hr over 60 Minutes Intravenous  Once 11/25/22 1858 11/25/22 2034           Data Reviewed:  I have personally reviewed the following...  CBC: Recent Labs  Lab 11/25/22 1714 11/26/22 0611  WBC 6.4 6.4  NEUTROABS 3.5  --   HGB 12.4 11.8*  HCT 38.0 34.2*  MCV 94.5 90.0  PLT 282 259   Basic Metabolic Panel: Recent Labs  Lab 11/25/22 1832 11/26/22 0611  NA 138 140  K 4.5 4.0  CL 105 109  CO2 25 25  GLUCOSE 90 111*  BUN 20 17  CREATININE 0.84 0.77  CALCIUM 8.8* 8.7*   GFR: Estimated Creatinine Clearance: 67.6 mL/min (by C-G formula based on SCr of 0.77 mg/dL). Liver Function Tests: No results for input(s): "AST", "ALT", "ALKPHOS", "BILITOT", "PROT", "ALBUMIN" in the last 168 hours. No results for input(s): "LIPASE", "AMYLASE" in the last 168 hours. No results for input(s): "AMMONIA" in the last 168 hours. Coagulation Profile: No results for input(s): "INR", "PROTIME" in the last 168 hours. Cardiac Enzymes: No results for input(s): "CKTOTAL", "CKMB", "CKMBINDEX", "TROPONINI" in the last 168 hours. BNP (last 3 results) No results for input(s): "PROBNP" in the last 8760 hours. HbA1C: No results for input(s): "HGBA1C" in the last  72 hours. CBG: No results for input(s): "GLUCAP" in the last 168 hours. Lipid Profile: No results for input(s): "CHOL", "HDL", "LDLCALC", "TRIG", "CHOLHDL", "LDLDIRECT" in the last 72 hours. Thyroid Function Tests: No results for input(s): "TSH", "T4TOTAL", "FREET4", "T3FREE", "THYROIDAB" in the last 72 hours. Anemia Panel: No results for input(s): "VITAMINB12", "FOLATE", "FERRITIN", "TIBC", "IRON", "RETICCTPCT" in the last 72 hours. Most Recent Urinalysis On File:  No results found for: "COLORURINE", "APPEARANCEUR", "LABSPEC", "PHURINE", "GLUCOSEU", "HGBUR", "BILIRUBINUR", "KETONESUR", "PROTEINUR", "UROBILINOGEN", "NITRITE", "LEUKOCYTESUR" Sepsis Labs: @LABRCNTIP (procalcitonin:4,lacticidven:4) Microbiology: No results found for this or any previous visit (from the past 240 hour(s)).    Radiology Studies last 3 days: US Venous Img Lower Bilateral (DVT)  Result Date: 11/26/2022 CLINICAL DATA:  Bilateral lower extremity pain and swelling with decreased mobility EXAM: Bilateral lower Extremity Venous Doppler Ultrasound TECHNIQUE: Gray-scale sonography with compression, as well as color and duplex ultrasound, were performed to evaluate the deep venous system(s) from the level of the common femoral vein through the popliteal and proximal calf veins. COMPARISON:  None available FINDINGS: VENOUS Normal compressibility of the common femoral, superficial femoral, and popliteal veins, as well as the visualized calf veins. Visualized portions of profunda femoral vein and great saphenous vein unremarkable. No filling defects to suggest DVT on grayscale or color Doppler imaging. Doppler waveforms show normal direction of venous flow, normal respiratory plasticity and response to augmentation. OTHER None. Limitations: none IMPRESSION: No  lower extremity DVT. Electronically Signed   By: Acquanetta Belling M.D.   On: 11/26/2022 11:42   DG Chest 2 View  Result Date: 11/25/2022 CLINICAL DATA:  rib pain EXAM: CHEST -  2 VIEW COMPARISON:  CT scan chest from 11/23/2022. FINDINGS: There are patchy atelectatic changes at bilateral lung bases, left more than right. Correlate clinically for superimposed pneumonia. Bilateral lung fields are otherwise clear. Bilateral costophrenic angles are clear. Stable cardio-mediastinal silhouette. No acute osseous abnormalities. Multiple acute rib fractures seen on recent chest CT scan are not well visualized on this exam. Old healed fracture of posterolateral right fifth rib noted. The soft tissues are within normal limits. IMPRESSION: *Patchy atelectatic changes at bilateral lung bases. *Please refer  to recent CT scan chest from 11/23/2022 for details. Electronically Signed   By: Jules Schick M.D.   On: 11/25/2022 14:43   CT Chest Wo Contrast  Result Date: 11/23/2022 CLINICAL DATA:  Fall several days ago. Evaluate for rib fractures and pneumothorax. EXAM: CT CHEST WITHOUT CONTRAST TECHNIQUE: Multidetector CT imaging of the chest was performed following the standard protocol without IV contrast. RADIATION DOSE REDUCTION: This exam was performed according to the departmental dose-optimization program which includes automated exposure control, adjustment of the mA and/or kV according to patient size and/or use of iterative reconstruction technique. COMPARISON:  None Available. FINDINGS: Cardiovascular: Heart is normal size. Aorta is normal caliber. Scattered coronary artery and aortic calcifications. Mediastinum/Nodes: No mediastinal, hilar, or axillary adenopathy. Trachea and esophagus are unremarkable. Thyroid unremarkable. Lungs/Pleura: Bibasilar atelectasis.  No effusions or pneumothorax. Upper Abdomen: No acute findings. Musculoskeletal: Bilateral breast implants, grossly unremarkable. Chest wall soft tissues are unremarkable. Nondisplaced anterolateral right rib fractures involving the 3rd through 7th ribs. Old healed posterolateral 5th rib fracture. IMPRESSION: Acute nondisplaced right  anterolateral 3rd through 7th rib fractures. No associated pneumothorax or effusion. Bibasilar atelectasis. Scattered coronary artery calcifications. Aortic Atherosclerosis (ICD10-I70.0). Electronically Signed   By: Charlett Nose M.D.   On: 11/23/2022 00:56   DG Chest 2 View  Result Date: 11/22/2022 CLINICAL DATA:  Right rib pain after fall. EXAM: CHEST - 2 VIEW COMPARISON:  12/11/2020 FINDINGS: Heart and mediastinal contours within normal limits. Linear densities in the lung bases compatible with atelectasis or scarring. No effusions or pneumothorax. No visible displaced acute rib fracture. Old healed right 5th rib fracture. IMPRESSION: Bibasilar atelectasis or scarring. No visible acute rib fracture. Electronically Signed   By: Charlett Nose M.D.   On: 11/22/2022 23:09             LOS: 0 days       Sunnie Nielsen, DO [facility] 11/26/2022, 3:44 PM    Dictation software may have been used to generate the above note. Typos may occur and escape review in typed/dictated notes. Please contact Dr Lyn Hollingshead directly for clarity if needed.  Staff may message me via secure chat in Epic  but this may not receive an immediate response,  please page me for urgent matters!  If 7PM-7AM, please contact night coverage [email or web address]

## 2022-11-27 DIAGNOSIS — R52 Pain, unspecified: Secondary | ICD-10-CM | POA: Diagnosis not present

## 2022-11-27 MED ORDER — HYDROCODONE-ACETAMINOPHEN 7.5-325 MG PO TABS: ORAL_TABLET | ORAL | 0 refills | Status: AC

## 2022-11-27 MED ORDER — SENNOSIDES-DOCUSATE SODIUM 8.6-50 MG PO TABS: 1.0000 | ORAL_TABLET | Freq: Every evening | ORAL | Status: DC | PRN

## 2022-11-27 MED ORDER — NAPROXEN 375 MG PO TABS: 375.0000 mg | ORAL_TABLET | Freq: Three times a day (TID) | ORAL | 0 refills | Status: AC

## 2022-11-27 MED ORDER — HYDROCODONE-ACETAMINOPHEN 7.5-325 MG PO TABS
1.0000 | ORAL_TABLET | ORAL | Status: DC | PRN
  Administered 2022-11-27: 1 via ORAL
  Filled 2022-11-27: qty 1

## 2022-11-27 MED ORDER — LIDOCAINE 5 % EX PTCH: 1.0000 | MEDICATED_PATCH | Freq: Two times a day (BID) | CUTANEOUS | 0 refills | Status: DC

## 2022-11-27 MED ORDER — METHOCARBAMOL 1000 MG PO TABS: 500.0000 mg | ORAL_TABLET | Freq: Three times a day (TID) | ORAL | 0 refills | Status: AC

## 2022-11-27 NOTE — Plan of Care (Signed)
  Problem: Education: Goal: Knowledge of General Education information will improve Description: Including pain rating scale, medication(s)/side effects and non-pharmacologic comfort measures Outcome: Progressing   Problem: Clinical Measurements: Goal: Ability to maintain clinical measurements within normal limits will improve Outcome: Progressing   

## 2022-11-27 NOTE — Discharge Summary (Signed)
Physician Discharge Summary   Patient: Gabrielle Riley MRN: [id number]  DOB: December 09, 1965   Admit:     Date of Admission: 11/25/2022 Admitted from: home   Discharge: Date of discharge: 11/27/22 Disposition: Home Condition at discharge: good  CODE STATUS: FULL CODE     Discharge Physician: Sunnie Nielsen, DO [facility]     PCP: [facility]  Recommendations for Outpatient Follow-up:  Follow up with PCP [place] in 5 days for pain management / refills Please obtain labs/tests: consider CXR, CBC, BMP at follow-up  Please follow up on the following pending results: none PCP AND OTHER OUTPATIENT PROVIDERS: SEE BELOW FOR SPECIFIC DISCHARGE INSTRUCTIONS PRINTED FOR PATIENT IN ADDITION TO GENERIC AVS PATIENT INFO     Discharge Instructions     Diet - low sodium heart healthy   Complete by: As directed    Increase activity slowly   Complete by: As directed          Discharge Diagnoses: Principal Problem:   Inadequate pain control d/t rib fracture  Active Problems:   Dilated cardiomyopathy (HCC)   Chronic HFrEF (heart failure with reduced ejection fraction) (HCC)   Tobacco abuse   CAD (coronary artery disease)   Hypothyroidism   Leg swelling       Hospital Course: Gabrielle Riley is a 57 year old female with history of heart failure reduced ejection fraction, hypothyroid, hypertension, hyperlipidemia, who presents emergency department 08/06 for chief concerns of rib pain and swelling of the lower extremities. Of note, patient was seen in the emergency department on 08/03 for mechanical fall with right rib cage pain.  Patient was found to have multiple rib fractures, and was given IV pain medication and was advised to be admitted to the hospital for monitoring given IV pain medications.  Patient declined at that time stating that she has to go to work. She returned for the same pain in that area along with swelling of her  lower extremities. 08/06: to ED. Chest x-ray 2 view: Stated to refer to CT scan for further details.  Patchy atelectasis changes in bilateral lung base. GIven morphine, robaxin, azithromycin. Admitted to hospitalist service for pain control. Of note: admitting hospitalist did not note any swelling of her lower extremities or pitting edema, pt reported that it had improved significantly since earlier that morning.  08/07: weaning off IV pain meds 08/08: pain controlled on Hydrocodone-APAP 7.5-325, Robaxin 1000 mg tid, Naproxen 325 mg tid, Lidoderm. Pt appropriate for discharge, she is instructed to try using half-tablets of the above medications as able, advised to check labs given potential for nephrotoxicity but she is not taking lasix regularly and renal function here has been normal, minimal relative risk   Consultants:  none  Procedures: none      ASSESSMENT & PLAN:   Inadequate pain control Secondary to multiple rib fracture Started po hydrocodone-APAP 7.5-325 mg q6h prn, naproxen 375 mg tid, methocarbamol 1000 mg tid, lidoderm patches Encourage incentive spirometry and flutter valve use extensively at bedside   Leg swelling Improved w/ Lasix, continue prn  Ultrasound of the lower extremities ordered to assess for DVT --> negative   Hypothyroidism Home levothyroxine resumed on admission   CAD (coronary artery disease) Home atorvastatin nightly resumed   Chronic HFrEF (heart failure with reduced ejection fraction) (HCC) Home Coreg and Entresto resumed on admission Per outpatient cardiology/heart failure note on 11/06/2022: Patient is prescribed furosemide 20 mg daily as needed, carvedilol 3.125 mg  p.o. twice daily, Entresto 24/26 mg p.o. twice daily              Discharge Instructions  Allergies as of 11/27/2022       Reactions   Imitrex [sumatriptan]    Throat closing   Sulfa Antibiotics         Medication List     STOP taking these medications     oxyCODONE 5 MG immediate release tablet Commonly known as: Roxicodone       TAKE these medications    aspirin 81 MG chewable tablet Chew 1 tablet (81 mg total) by mouth once daily.   atorvastatin 40 MG tablet Commonly known as: LIPITOR Take 1 tablet (40 mg total) by mouth daily.   carvedilol 3.125 MG tablet Commonly known as: COREG TAKE 1 TABLET BY MOUTH TWICE A DAY WITH FOOD   CENTRUM SILVER 50+WOMEN PO Take 1 tablet by mouth daily.   furosemide 20 MG tablet Commonly known as: LASIX Take 1 tablet (20 mg total) by mouth once daily. What changed: additional instructions   HYDROcodone-acetaminophen 7.5-325 MG tablet Commonly known as: NORCO Take 0.5-1 tablets by mouth every 4 (four) hours as needed for 2 days for severe pain or moderate pain, THEN 0.5-1 tablets every 6 (six) hours as needed for up to 3 days for severe pain or moderate pain. Start taking on: November 27, 2022   levothyroxine 100 MCG tablet Commonly known as: SYNTHROID Take 100 mcg by mouth daily before breakfast.   lidocaine 5 % Commonly known as: Lidoderm Place 1-2 patches onto the skin every 12 (twelve) hours. Remove & Discard patch within 12 hours or as directed by MD What changed: how much to take   Methocarbamol 1000 MG Tabs Take 500-1,000 mg by mouth 3 (three) times daily for 7 days.   naproxen 375 MG tablet Commonly known as: NAPROSYN Take 1 tablet (375 mg total) by mouth 3 (three) times daily with meals for 7 days. What changed:  medication strength how much to take when to take this   nitroGLYCERIN 0.4 MG SL tablet Commonly known as: NITROSTAT Place 1 tablet (0.4 mg total) under the tongue every 5 (five) minutes as needed for chest pain.   sacubitril-valsartan 24-26 MG Commonly known as: ENTRESTO Take 1 tablet by mouth 2 (two) times daily.   senna-docusate 8.6-50 MG tablet Commonly known as: Senokot-S Take 1-2 tablets by mouth at bedtime as needed for mild constipation.          Follow-up Information     Maxwell Caul, MD. Go on 12/04/2022.   Specialty: Internal Medicine Why: hospital follow up in 5-7 days, pain management w/ rib fracture. Go at 3:00pm. Contact information: [address] [place] [address] [phone]                 Allergies  Allergen Reactions   Imitrex [Sumatriptan]     Throat closing   Sulfa Antibiotics      Subjective: pt reports pain is okay today but still bad with movement, no SOB or chest pain    Discharge Exam: BP 115/79 (BP Location: Left Arm)   Pulse 73   Temp 98 F (36.7 C)   Resp 18   Ht 5\' 1"  (1.549 m)   Wt 68 kg   SpO2 100%   BMI 28.33 kg/m  General: Pt is alert, awake, not in acute distress Cardiovascular: RRR, S1/S2 +, no rubs, no gallops Respiratory: CTA bilaterally, no wheezing, no rhonchi  Abdominal: Soft, NT, ND, bowel sounds + Extremities: no edema, no cyanosis     The results of significant diagnostics from this hospitalization (including imaging, microbiology, ancillary and laboratory) are listed below for reference.     Microbiology: No results found for this or any previous visit (from the past 240 hour(s)).   Labs: BNP (last 3 results) Recent Labs    11/25/22 1714  BNP 64.2   Basic Metabolic Panel: Recent Labs  Lab 11/25/22 1832 11/26/22 0611  NA 138 140  K 4.5 4.0  CL 105 109  CO2 25 25  GLUCOSE 90 111*  BUN 20 17  CREATININE 0.84 0.77  CALCIUM 8.8* 8.7*   Liver Function Tests: No results for input(s): "AST", "ALT", "ALKPHOS", "BILITOT", "PROT", "ALBUMIN" in the last 168 hours. No results for input(s): "LIPASE", "AMYLASE" in the last 168 hours. No results for input(s): "AMMONIA" in the last 168 hours. CBC: Recent Labs  Lab 11/25/22 1714 11/26/22 0611  WBC 6.4 6.4  NEUTROABS 3.5  --   HGB 12.4 11.8*  HCT 38.0 34.2*  MCV 94.5 90.0  PLT 282 259   Cardiac Enzymes: No results for input(s): "CKTOTAL", "CKMB", "CKMBINDEX", "TROPONINI" in the last 168  hours. BNP: Invalid input(s): "POCBNP" CBG: No results for input(s): "GLUCAP" in the last 168 hours. D-Dimer No results for input(s): "DDIMER" in the last 72 hours. Hgb A1c No results for input(s): "HGBA1C" in the last 72 hours. Lipid Profile No results for input(s): "CHOL", "HDL", "LDLCALC", "TRIG", "CHOLHDL", "LDLDIRECT" in the last 72 hours. Thyroid function studies No results for input(s): "TSH", "T4TOTAL", "T3FREE", "THYROIDAB" in the last 72 hours.  Invalid input(s): "FREET3" Anemia work up No results for input(s): "VITAMINB12", "FOLATE", "FERRITIN", "TIBC", "IRON", "RETICCTPCT" in the last 72 hours. Urinalysis No results found for: "COLORURINE", "APPEARANCEUR", "LABSPEC", "PHURINE", "GLUCOSEU", "HGBUR", "BILIRUBINUR", "KETONESUR", "PROTEINUR", "UROBILINOGEN", "NITRITE", "LEUKOCYTESUR" Sepsis Labs Recent Labs  Lab 11/25/22 1714 11/26/22 0611  WBC 6.4 6.4   Microbiology No results found for this or any previous visit (from the past 240 hour(s)). Imaging US Venous Img Lower Bilateral (DVT)  Result Date: 11/26/2022 CLINICAL DATA:  Bilateral lower extremity pain and swelling with decreased mobility EXAM: Bilateral lower Extremity Venous Doppler Ultrasound TECHNIQUE: Gray-scale sonography with compression, as well as color and duplex ultrasound, were performed to evaluate the deep venous system(s) from the level of the common femoral vein through the popliteal and proximal calf veins. COMPARISON:  None available FINDINGS: VENOUS Normal compressibility of the common femoral, superficial femoral, and popliteal veins, as well as the visualized calf veins. Visualized portions of profunda femoral vein and great saphenous vein unremarkable. No filling defects to suggest DVT on grayscale or color Doppler imaging. Doppler waveforms show normal direction of venous flow, normal respiratory plasticity and response to augmentation. OTHER None. Limitations: none IMPRESSION: No  lower extremity DVT.  Electronically Signed   By: Acquanetta Belling M.D.   On: 11/26/2022 11:42   DG Chest 2 View  Result Date: 11/25/2022 CLINICAL DATA:  rib pain EXAM: CHEST - 2 VIEW COMPARISON:  CT scan chest from 11/23/2022. FINDINGS: There are patchy atelectatic changes at bilateral lung bases, left more than right. Correlate clinically for superimposed pneumonia. Bilateral lung fields are otherwise clear. Bilateral costophrenic angles are clear. Stable cardio-mediastinal silhouette. No acute osseous abnormalities. Multiple acute rib fractures seen on recent chest CT scan are not well visualized on this exam. Old healed fracture of posterolateral right fifth rib noted. The soft tissues are within normal limits.  IMPRESSION: *Patchy atelectatic changes at bilateral lung bases. *Please refer to recent CT scan chest from 11/23/2022 for details. Electronically Signed   By: Jules Schick M.D.   On: 11/25/2022 14:43      Time coordinating discharge: over 30 minutes  SIGNED:  Sunnie Nielsen DO [facility]

## 2022-11-28 ENCOUNTER — Ambulatory Visit: Admission: RE | Admit: 2022-11-28 | Payer: No Typology Code available for payment source | Source: Ambulatory Visit

## 2022-12-02 ENCOUNTER — Encounter: Payer: Self-pay | Admitting: Family

## 2022-12-03 ENCOUNTER — Encounter: Payer: Medicaid Other | Admitting: Family

## 2022-12-04 ENCOUNTER — Other Ambulatory Visit: Payer: Self-pay | Admitting: Nurse Practitioner

## 2022-12-04 ENCOUNTER — Other Ambulatory Visit: Payer: Self-pay

## 2022-12-04 DIAGNOSIS — I2510 Atherosclerotic heart disease of native coronary artery without angina pectoris: Secondary | ICD-10-CM | POA: Diagnosis not present

## 2022-12-04 DIAGNOSIS — S2241XD Multiple fractures of ribs, right side, subsequent encounter for fracture with routine healing: Secondary | ICD-10-CM | POA: Diagnosis not present

## 2022-12-04 DIAGNOSIS — S51011D Laceration without foreign body of right elbow, subsequent encounter: Secondary | ICD-10-CM | POA: Diagnosis not present

## 2022-12-04 MED ORDER — LIDOCAINE 5 % EX OINT
1.0000 | TOPICAL_OINTMENT | CUTANEOUS | 0 refills | Status: DC | PRN
  Filled 2022-12-04: qty 35.44, 30d supply, fill #0

## 2022-12-04 NOTE — Progress Notes (Signed)
Lidocaine patch not covered-substituted ointment and sent to the [facility] community [facility] in [place]

## 2022-12-05 ENCOUNTER — Other Ambulatory Visit: Payer: Self-pay

## 2022-12-17 ENCOUNTER — Other Ambulatory Visit: Payer: Self-pay

## 2023-02-02 ENCOUNTER — Ambulatory Visit
Admission: RE | Admit: 2023-02-02 | Discharge: 2023-02-02 | Disposition: A | Discharge status: Home / Self Care | Payer: 59 | Source: Ambulatory Visit | Attending: Family | Admitting: Family

## 2023-02-02 DIAGNOSIS — Z720 Tobacco use: Secondary | ICD-10-CM | POA: Insufficient documentation

## 2023-02-02 DIAGNOSIS — I509 Heart failure, unspecified: Secondary | ICD-10-CM | POA: Insufficient documentation

## 2023-02-02 DIAGNOSIS — I081 Rheumatic disorders of both mitral and tricuspid valves: Secondary | ICD-10-CM | POA: Diagnosis not present

## 2023-02-02 DIAGNOSIS — I5022 Chronic systolic (congestive) heart failure: Secondary | ICD-10-CM

## 2023-02-02 DIAGNOSIS — R509 Fever, unspecified: Secondary | ICD-10-CM | POA: Insufficient documentation

## 2023-02-02 LAB — ECHOCARDIOGRAM COMPLETE
AR max vel: 2.27 cm2
AV Area VTI: 2.48 cm2
AV Area mean vel: 2.23 cm2
AV Mean grad: 3 mm[Hg]
AV Peak grad: 5.5 mm[Hg]
Ao pk vel: 1.17 m/s
Area-P 1/2: 3.87 cm2
Calc EF: 53.3 %
S' Lateral: 3.3 cm
Single Plane A2C EF: 52.5 %
Single Plane A4C EF: 53.3 %

## 2023-02-02 NOTE — Progress Notes (Signed)
*  PRELIMINARY RESULTS* Echocardiogram 2D Echocardiogram has been performed.  Cristela Blue 02/02/2023, 9:49 AM

## 2023-02-05 NOTE — Progress Notes (Deleted)
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   Was in the ED 11/22/22 due to poorly controlled right-sided chest wall discomfort after her fall a few days ago. Chest CT showed acute nondisplaced right anterolateral 3rd through 7th rib fractures. Admission recommended for pain control but patient needed to go home. Admitted 11/25/22 due to rib pain and swelling of the lower extremities.  Patchy atelectasis changes in bilateral lung base. GIven morphine, robaxin, azithromycin. Weaned off IV pain meds to oral meds with adequate pain control.   Echo 12/12/20: EF 45-50% along with apical hypokinesis suggesting Takotsubo Echo 02/01/21: EF 45-50%.  Echo 02/02/23: EF 55-60% with Grade I DD, trivial MR, RV normal  LHC done 12/11/20 and showed  Inferolateral STEMI due to spontaneous coronary artery dissection of small-moderate caliber branch arising from OM2.  Vessel is too small/distal for intervention. Mild, nonobstructive coronary artery disease involving the LAD and nondominant RCA. Moderately reduced left ventricular systolic function with apical akinesis and otherwise preserved left ventricular ejection fraction.  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 Moderately-severely elevated left ventricular filling pressure consistent with acute heart failure.  She presents today for a HF follow-up visit with a chief complaint of minimal SOB with moderate exertion. Chronic in nature. Has worsened with the hot/ humid weather. Has associated fatigue along with this. Denies chest pain, cough, palpitations, abdominal distention, pedal edema, dizziness or  difficulty sleeping.   Currently is no longer working. Only takes her furosemide PRN because it makes her urinate too much. She says that she hasn't taken furosemide in a "long time"  ROS: All systems negative except as listed in HPI, PMH and Problem List.  SH:  Social History   Socioeconomic History   Marital status: Significant Other    Spouse name: Not on file   Number of children: Not on file   Years of education: Not on file   Highest education level: Not on file  Occupational History   Not on file  Tobacco Use   Smoking status: Every Day    Current packs/day: 0.50    Average packs/day: 0.5 packs/day for 25.0 years (12.5 ttl pk-yrs)    Types: Cigarettes   Smokeless tobacco: Never   Tobacco comments:    Ready to Quit, has not set a quit date yet.   Vaping Use   Vaping status: Never Used  Substance and Sexual Activity   Alcohol use: Not Currently    Comment: 1-2 drinks occassionally   Drug use: Not Currently   Sexual activity: Not Currently    Birth control/protection: Post-menopausal  Other Topics Concern   Not on file  Social History Narrative   Not on file   Social Determinants of Health   Financial Resource Strain: Not on file  Food Insecurity: No Food Insecurity (11/25/2022)   Hunger Vital Sign    Worried About Running Out of Food in the Last Year: Never true    Ran Out of Food in the Last Year: Never true  Transportation Needs: No Transportation Needs (11/25/2022)   PRAPARE - Administrator, Civil Service (Medical): No    Lack of Transportation (Non-Medical): No  Physical Activity: Not on  file  Stress: Not on file  Social Connections: Not on file  Intimate Partner Violence: Not At Risk (11/25/2022)   Humiliation, Afraid, Rape, and Kick questionnaire    Fear of Current or Ex-Partner: No    Emotionally Abused: No    Physically Abused: No    Sexually Abused: No    FH:  Family History  Problem Relation Age of Onset   Diabetes Sister    Heart  attack Brother     Past Medical History:  Diagnosis Date   Acute ST elevation myocardial infarction (STEMI) of inferolateral wall (HCC)    a. 11/2020 STEMI 2/2 SCAD involving OM2-->Med rx.   CAD (coronary artery disease)    a. 11/2020 STEMI/Cath: LM nl, LAD 61m, LCX nl, OM2 75/100 (SCAD), RCA 15p-->Med Rx.   FHx: rheumatic fever    Hyperlipidemia LDL goal <70    Hypothyroidism    Spontaneous dissection of coronary artery    a. 11/2020 ->OM2 75/100-->Med rx; b. 01/2021 Renal Duplex: No RAS or evidence of FMD. Nl Celiac/SMA/IMA.   Stress-induced cardiomyopathy    a. 11/2020 Echo: EF 45-50%, apical HK w/ hypercontractile LV basal segments sugg of stress-induced CM. Gr1 DD, nl RV fxn; b. 01/2021 Echo: EF 45-50%, sev mid-dist peri-apical/apical HK. Basal wall well preserved. Nl RV fxn.   Tobacco abuse     Current Outpatient Medications  Medication Sig Dispense Refill   aspirin 81 MG chewable tablet Chew 1 tablet (81 mg total) by mouth once daily. 90 tablet 3   atorvastatin (LIPITOR) 40 MG tablet Take 1 tablet (40 mg total) by mouth daily. 90 tablet 3   carvedilol (COREG) 3.125 MG tablet TAKE 1 TABLET BY MOUTH TWICE A DAY WITH FOOD 180 tablet 1   furosemide (LASIX) 20 MG tablet Take 1 tablet (20 mg total) by mouth once daily. (Patient taking differently: Take 20 mg by mouth daily. As needed) 90 tablet 3   levothyroxine (SYNTHROID) 100 MCG tablet Take 100 mcg by mouth daily before breakfast.     lidocaine (XYLOCAINE) 5 % ointment Apply 1 Application topically as needed. 35.44 g 0   Multiple Vitamins-Minerals (CENTRUM SILVER 50+WOMEN PO) Take 1 tablet by mouth daily.     nitroGLYCERIN (NITROSTAT) 0.4 MG SL tablet Place 1 tablet (0.4 mg total) under the tongue every 5 (five) minutes as needed for chest pain. 25 tablet 3   sacubitril-valsartan (ENTRESTO) 24-26 MG Take 1 tablet by mouth 2 (two) times daily. 180 tablet 3   senna-docusate (SENOKOT-S) 8.6-50 MG tablet Take 1-2 tablets by mouth at bedtime  as needed for mild constipation.     No current facility-administered medications for this visit.      PHYSICAL EXAM:  General:  Well appearing. No resp difficulty HEENT: normal Neck: supple. JVP flat. No lymphadenopathy or thryomegaly appreciated. Cor: PMI normal. Regular rate & rhythm. No rubs, gallops or murmurs. Lungs: clear Abdomen: soft, nontender, nondistended. No hepatosplenomegaly. No bruits or masses.  Extremities: no cyanosis, clubbing, rash, edema Neuro: alert & oriented x3, cranial nerves grossly intact. Moves all 4 extremities w/o difficulty. Affect pleasant.   ECG:not done   ASSESSMENT & PLAN:  1: NICM with now preserved ejection fraction (Takotsubo)- - thought to be stress induced - NYHA class II - euvolemic today - weighing daily; reminded to call for an overnight weight gain of > 2 pounds or a weekly weight gain of > 5 pounds - weight 145.8 pounds from last visit here 3 months ago - Echo 12/12/20:  EF of 45-50% along with apical hypokinesis suggesting Takotsubo - Echo 02/01/21: EF of 45-50%. - Echo 02/02/23: EF 55-60% with Grade I DD, trivial MR, RV normal  - not adding salt to her food and has been reading food labels - continue carvedilol 3.125mg  BID - continue furosemide PRN - continue entresto 24/26mg  BID  2: History of STEMI due to spontaneous coronary artery dissection- - saw cardiology (End) 05/24 - LHC done 12/11/20 and showed    Inferolateral STEMI due to spontaneous coronary artery dissection of small-moderate caliber branch arising from OM2.  Vessel is too small/distal for intervention.   Mild, nonobstructive coronary artery disease involving the LAD and nondominant RCA.   Moderately reduced left ventricular systolic function with apical akinesis and otherwise preserved left ventricular ejection fraction.  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   Moderately-severely elevated left ventricular  filling pressure consistent with acute heart failure. - BMP 11/26/22 reviewed and showed sodium 140, potassium 4.0, creatinine 0.77 & GFR >60 - LDL 74 on 08/21/22 - continue atorvastatin 40mg  daily  3: Tobacco- - smoking 1/4-1/3 ppd of cigarettes  - has previously stopped smoking for a few years  - complete cessation discussed with her - saw PCP @ [facility] 08/24

## 2023-02-06 ENCOUNTER — Encounter: Payer: Self-pay | Admitting: Family

## 2023-02-06 ENCOUNTER — Encounter: Payer: 59 | Admitting: Family

## 2023-04-30 ENCOUNTER — Other Ambulatory Visit: Payer: Self-pay | Admitting: Family
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
## 2023-05-07 ENCOUNTER — Ambulatory Visit: Payer: 59 | Attending: Nurse Practitioner | Admitting: Nurse Practitioner

## 2023-05-07 ENCOUNTER — Encounter: Payer: Self-pay | Admitting: Nurse Practitioner

## 2023-05-07 VITALS — BP 127/71 | HR 54 | Ht 61.0 in | Wt 145.4 lb

## 2023-05-07 DIAGNOSIS — E785 Hyperlipidemia, unspecified: Secondary | ICD-10-CM | POA: Diagnosis not present

## 2023-05-07 DIAGNOSIS — I255 Ischemic cardiomyopathy: Secondary | ICD-10-CM

## 2023-05-07 DIAGNOSIS — I2510 Atherosclerotic heart disease of native coronary artery without angina pectoris: Secondary | ICD-10-CM | POA: Diagnosis not present

## 2023-05-07 DIAGNOSIS — R5383 Other fatigue: Secondary | ICD-10-CM | POA: Diagnosis not present

## 2023-05-07 DIAGNOSIS — I5032 Chronic diastolic (congestive) heart failure: Secondary | ICD-10-CM | POA: Diagnosis not present

## 2023-05-07 DIAGNOSIS — I2542 Coronary artery dissection: Secondary | ICD-10-CM | POA: Diagnosis not present

## 2023-05-07 DIAGNOSIS — G4730 Sleep apnea, unspecified: Secondary | ICD-10-CM

## 2023-05-07 DIAGNOSIS — Z720 Tobacco use: Secondary | ICD-10-CM | POA: Diagnosis not present

## 2023-05-07 NOTE — Patient Instructions (Signed)
Medication Instructions:  Your Physician recommend you continue on your current medication as directed.    *If you need a refill on your cardiac medications before your next appointment, please call your pharmacy*   Lab Work: Your provider would like for you to have following labs drawn today CBC, CMeT, and TSH.   If you have labs (blood work) drawn today and your tests are completely normal, you will receive your results only by: MyChart Message (if you have MyChart) OR A paper copy in the mail If you have any lab test that is abnormal or we need to change your treatment, we will call you to review the results.   Follow-Up: At [facility], you and your health needs are our priority.  As part of our continuing mission to provide you with exceptional heart care, we have created designated Provider Care Teams.  These Care Teams include your primary Cardiologist (physician) and Advanced Practice Providers (APPs -  Physician Assistants and Nurse Practitioners) who all work together to provide you with the care you need, when you need it.  We recommend signing up for the patient portal called "MyChart".  Sign up information is provided on this After Visit Summary.  MyChart is used to connect with patients for Virtual Visits (Telemedicine).  Patients are able to view lab/test results, encounter notes, upcoming appointments, etc.  Non-urgent messages can be sent to your provider as well.   To learn more about what you can do with MyChart, go to [email or web address].    Your next appointment:   6 month(s)  Provider:   You may see Yvonne Kendall, MD

## 2023-05-08 LAB — CBC
Hematocrit: 40.6 % (ref 34.0–46.6)
Hemoglobin: 12.9 g/dL (ref 11.1–15.9)
MCH: 29.7 pg (ref 26.6–33.0)
MCHC: 31.8 g/dL (ref 31.5–35.7)
MCV: 94 fL (ref 79–97)
Platelets: 329 10*3/uL (ref 150–450)
RBC: 4.34 x10E6/uL (ref 3.77–5.28)
RDW: 11.9 % (ref 11.7–15.4)
WBC: 5.9 10*3/uL (ref 3.4–10.8)

## 2023-05-08 LAB — COMPREHENSIVE METABOLIC PANEL
ALT: 10 [IU]/L (ref 0–32)
AST: 18 [IU]/L (ref 0–40)
Albumin: 4.1 g/dL (ref 3.8–4.9)
Alkaline Phosphatase: 133 [IU]/L — ABNORMAL HIGH (ref 44–121)
BUN/Creatinine Ratio: 21 (ref 9–23)
BUN: 18 mg/dL (ref 6–24)
Bilirubin Total: 0.3 mg/dL (ref 0.0–1.2)
CO2: 24 mmol/L (ref 20–29)
Calcium: 9.2 mg/dL (ref 8.7–10.2)
Chloride: 103 mmol/L (ref 96–106)
Creatinine, Ser: 0.84 mg/dL (ref 0.57–1.00)
Globulin, Total: 2.1 g/dL (ref 1.5–4.5)
Glucose: 99 mg/dL (ref 70–99)
Potassium: 4.8 mmol/L (ref 3.5–5.2)
Sodium: 139 mmol/L (ref 134–144)
Total Protein: 6.2 g/dL (ref 6.0–8.5)
eGFR: 80 mL/min/{1.73_m2} (ref >59)

## 2023-05-08 LAB — TSH: TSH: 1.76 u[IU]/mL (ref 0.450–4.500)

## 2023-05-21 ENCOUNTER — Institutional Professional Consult (permissible substitution): Payer: 59 | Admitting: Sleep Medicine

## 2023-06-06 ENCOUNTER — Telehealth: Payer: Self-pay | Admitting: Physician Assistant

## 2023-06-06 NOTE — Telephone Encounter (Signed)
 I got a message from the answering service stating that a massage therapist was at the patient's apartment and wanted to make sure with cardiology that it was safe for her to get a massage.  Called the patient back and got no answer but left a message that it was safe for her to have a gentle massage.   Theodore Demark, PA-C 06/06/2023 3:02 PM

## 2023-08-31 ENCOUNTER — Telehealth: Payer: Self-pay

## 2023-08-31 NOTE — Telephone Encounter (Signed)
 RENEWAL APP IS DUE, MAILING DOCUMENTS TO PT

## 2023-09-01 NOTE — Telephone Encounter (Signed)
 PAP: Patient assistance application for Entresto  through [employer] has been mailed to pt's home address on file. Provider portion of application will be faxed to provider's office once pt portion has been received.

## 2023-09-25 ENCOUNTER — Other Ambulatory Visit (HOSPITAL_COMMUNITY): Payer: Self-pay

## 2023-10-08 ENCOUNTER — Telehealth: Payer: Self-pay

## 2023-10-08 MED ORDER — SACUBITRIL-VALSARTAN 24-26 MG PO TABS: 1.0000 | ORAL_TABLET | Freq: Two times a day (BID) | ORAL | 3 refills | Status: DC

## 2023-10-08 NOTE — Telephone Encounter (Signed)
 New RX sent to [facility] in [place]

## 2023-10-08 NOTE — Telephone Encounter (Signed)
 Received a fax notice in the [facility] [employer] Patient Assistance [employer] que requesting a refill for ENTRESTO  (see chart media). Please send in prescription for ENTRESTO  to [facility]

## 2023-10-15 ENCOUNTER — Other Ambulatory Visit: Payer: Self-pay

## 2023-10-15 MED ORDER — SACUBITRIL-VALSARTAN 24-26 MG PO TABS: 1.0000 | ORAL_TABLET | Freq: Two times a day (BID) | ORAL | 1 refills | Status: DC

## 2023-10-16 ENCOUNTER — Other Ambulatory Visit: Payer: Self-pay | Admitting: Nurse Practitioner

## 2023-10-16 ENCOUNTER — Other Ambulatory Visit (HOSPITAL_COMMUNITY): Payer: Self-pay

## 2023-10-16 ENCOUNTER — Other Ambulatory Visit: Payer: Self-pay | Admitting: Family

## 2023-10-16 NOTE — Telephone Encounter (Signed)
 [facility] IN [place], REVIEWING

## 2023-10-19 ENCOUNTER — Telehealth: Payer: Self-pay | Admitting: Family

## 2023-10-19 NOTE — Telephone Encounter (Signed)
 Please contact pt for future appointment. Pt due for 6 month f/u.

## 2023-10-19 NOTE — Telephone Encounter (Signed)
 LVM to schedule f/u appt

## 2023-10-19 NOTE — Telephone Encounter (Signed)
 Called to confirm/remind patient of their appointment at the [facility] on 10/20/23.   Appointment:   [x] Confirmed  [] Left mess   [] No answer/No voice mail  [] VM Full/unable to leave message  [] Phone not in service  Patient reminded to bring all medications and/or complete list.  Confirmed patient has transportation. Gave directions, instructed to utilize valet parking.

## 2023-10-19 NOTE — Progress Notes (Unsigned)
 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   Has not been admitted or been in the ED in the last year.   Echo 12/12/20: EF of 45-50% along with apical hypokinesis suggesting Takotsubo Echo 02/01/21: EF of 45-50%.   LHC done 12/11/20 and showed  Inferolateral STEMI due to spontaneous coronary artery dissection of small-moderate caliber branch arising from OM2.  Vessel is too small/distal for intervention. Mild, nonobstructive coronary artery disease involving the LAD and nondominant RCA. Moderately reduced left ventricular systolic function with apical akinesis and otherwise preserved left ventricular ejection fraction.  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 Moderately-severely elevated left ventricular filling pressure consistent with acute heart failure.  She presents today for a HF follow-up visit with a chief complaint of minimal SOB with moderate exertion. Chronic in nature. Has worsened with the hot/ humid weather. Has associated fatigue along with this. Denies chest pain, cough, palpitations, abdominal distention, pedal edema, dizziness or difficulty sleeping.   Currently is no longer working. Only takes her furosemide  PRN because it makes her urinate too much. She says that she hasn't taken furosemide  in a long time     Review of Systems: [y] = yes, [ ]  = no   General: Weight gain [ ] ; Weight loss [ ] ; Anorexia [ ] ; Fatigue [ ] ; Fever [ ] ; Chills [ ] ; Weakness [ ]    Cardiac: Chest pain/pressure [ ] ; Resting SOB [ ] ; Exertional SOB [ ] ; Orthopnea [ ] ; Pedal Edema [ ] ; Palpitations [ ] ; Syncope [ ] ; Presyncope [ ] ; Paroxysmal nocturnal dyspnea[ ]   Pulmonary: Cough [ ] ; Wheezing[ ] ; Hemoptysis[ ] ; Sputum [ ] ; Snoring [ ]   GI: Vomiting[ ] ; Dysphagia[ ] ; Melena[ ] ; Hematochezia [ ] ; Heartburn[ ] ; Abdominal pain [ ] ; Constipation [ ] ; Diarrhea [ ] ; BRBPR [ ]   GU: Hematuria[ ] ; Dysuria [ ] ; Nocturia[ ]   Vascular: Pain in legs with walking [ ] ; Pain in feet with lying flat [ ] ; Non-healing sores [ ] ; Stroke [ ] ; TIA [ ] ; Slurred speech [ ] ;  Neuro: Headaches[ ] ; Vertigo[ ] ; Seizures[ ] ; Paresthesias[ ] ;Blurred vision [ ] ; Diplopia [ ] ; Vision changes [ ]   Ortho/Skin: Arthritis [ ] ; Joint pain [ ] ; Muscle pain [ ] ; Joint swelling [ ] ; Back Pain [ ] ; Rash [ ]   Psych: Depression[ ] ; Anxiety[ ]   Heme: Bleeding problems [ ] ; Clotting disorders [ ] ; Anemia [ ]   Endocrine: Diabetes [ ] ; Thyroid  dysfunction[ ]    Past Medical History:  Diagnosis Date   Acute ST elevation myocardial infarction (STEMI) of inferolateral wall (HCC)    a. 11/2020 STEMI 2/2 SCAD involving OM2-->Med rx.   CAD (coronary artery disease)    a. 11/2020 STEMI/Cath: LM nl, LAD 39m, LCX nl, OM2 75/100 (SCAD), RCA 15p-->Med Rx.   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 11/2020 Echo: EF 45-50%; b. 01/2021 Echo: EF 45-50%; c. 01/2023 Echo: EF 55-60%.   FHx: rheumatic fever    Hyperlipidemia LDL goal <70    Hypothyroidism  Spontaneous dissection of coronary artery    a. 11/2020 ->OM2 75/100-->Med rx; b. 01/2021 Renal Duplex: No RAS or evidence of FMD. Nl Celiac/SMA/IMA.   Stress-induced cardiomyopathy    a. 11/2020 Echo: EF 45-50%, apical HK w/ hypercontractile LV basal segments sugg of stress-induced CM. Gr1 DD, nl RV fxn; b. 01/2021 Echo: EF 45-50%, sev mid-dist peri-apical/apical HK. Basal wall well preserved. Nl RV fxn; c. 01/2023 Echo: EF 55-60%, GrI DD, nl RV  fxn, triv MR.   Tobacco abuse     Current Outpatient Medications  Medication Sig Dispense Refill   aspirin  81 MG chewable tablet Chew 1 tablet (81 mg total) by mouth once daily. 90 tablet 3   atorvastatin  (LIPITOR) 40 MG tablet TAKE 1 TABLET BY MOUTH EVERY DAY 30 tablet 0   carvedilol  (COREG ) 3.125 MG tablet TAKE 1 TABLET BY MOUTH TWICE A DAY WITH FOOD 180 tablet 1   furosemide  (LASIX ) 20 MG tablet Take 1 tablet (20 mg total) by mouth once daily. (Patient taking differently: Take 20 mg by mouth daily. As needed) 90 tablet 3   levothyroxine  (SYNTHROID ) 100 MCG tablet Take 100 mcg by mouth daily before breakfast.     lidocaine  (XYLOCAINE ) 5 % ointment Apply 1 Application topically as needed. 35.44 g 0   Multiple Vitamins-Minerals (CENTRUM SILVER 50+WOMEN PO) Take 1 tablet by mouth daily.     nitroGLYCERIN  (NITROSTAT ) 0.4 MG SL tablet Place 1 tablet (0.4 mg total) under the tongue every 5 (five) minutes as needed for chest pain. 25 tablet 3   sacubitril -valsartan  (ENTRESTO ) 24-26 MG Take 1 tablet by mouth 2 (two) times daily. 180 tablet 1   No current facility-administered medications for this visit.    Allergies  Allergen Reactions   Imitrex [Sumatriptan]     Throat closing   Sulfa  Antibiotics       Social History   Socioeconomic History   Marital status: Significant Other    Spouse name: Not on file   Number of children: Not on file   Years of education: Not on file   Highest education level: Not on file  Occupational History   Not on file  Tobacco Use   Smoking status: Every Day    Current packs/day: 0.50    Average packs/day: 0.5 packs/day for 25.0 years (12.5 ttl pk-yrs)    Types: Cigarettes   Smokeless tobacco: Never   Tobacco comments:    Ready to Quit, has not set a quit date yet.   Vaping Use   Vaping status: Never Used  Substance and Sexual Activity   Alcohol use: Not Currently    Comment: 1-2 drinks occassionally   Drug use: Not Currently   Sexual activity:  Not Currently    Birth control/protection: Post-menopausal  Other Topics Concern   Not on file  Social History Narrative   Not on file   Social Drivers of Health   Financial Resource Strain: Not on file  Food Insecurity: No Food Insecurity (11/25/2022)   Hunger Vital Sign    Worried About Running Out of Food in the Last Year: Never true    Ran Out of Food in the Last Year: Never true  Transportation Needs: No Transportation Needs (11/25/2022)   PRAPARE - Administrator, Civil Service (Medical): No    Lack of Transportation (Non-Medical): No  Physical Activity: Not on file  Stress: Not on file  Social Connections: Not on file  Intimate Partner Violence: Not At Risk (11/25/2022)   Humiliation,  Afraid, Rape, and Kick questionnaire    Fear of Current or Ex-Partner: No    Emotionally Abused: No    Physically Abused: No    Sexually Abused: No      Family History  Problem Relation Age of Onset   Diabetes Sister    Heart attack Brother     There were no vitals filed for this visit.   PHYSICAL EXAM: General:  Well appearing. No respiratory difficulty HEENT: normal Neck: supple. no JVD. Carotids 2+ bilat; no bruits. No lymphadenopathy or thyromegaly appreciated. Cor: PMI nondisplaced. Regular rate & rhythm. No rubs, gallops or murmurs. Lungs: clear Abdomen: soft, nontender, nondistended. No hepatosplenomegaly. No bruits or masses. Good bowel sounds. Extremities: no cyanosis, clubbing, rash, edema Neuro: alert & oriented x 3, cranial nerves grossly intact. moves all 4 extremities w/o difficulty. Affect pleasant.  ECG:   ASSESSMENT & PLAN:  1: Chronic ischemic heart failure with mildly reduced ejection fraction (Takotsubo)- - thought to be stress induced - NYHA Riley II - euvolemic today - weighing daily; reminded to call for an overnight weight gain of > 2 pounds or a weekly weight gain of > 5 pounds - Echo 12/12/20: EF of 45-50% along with apical hypokinesis  suggesting Takotsubo - Echo 02/01/21: EF of 45-50%. - will get echo updated & then consider adding SGLT/ spiro - not adding salt to her food and has been reading food labels - continue carvedilol  3.125mg  BID - continue furosemide  PRN - continue entresto  24/26mg  BID  2: History of STEMI due to spontaneous coronary artery dissection- - saw cardiology (End) 05/24 - LHC done 12/11/20 and showed  Inferolateral STEMI due to spontaneous coronary artery dissection of small-moderate caliber branch arising from OM2.  Vessel is too small/distal for intervention. Mild, nonobstructive coronary artery disease involving the LAD and nondominant RCA. Moderately reduced left ventricular systolic function with apical akinesis and otherwise preserved left ventricular ejection fraction.  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 Moderately-severely elevated left ventricular filling pressure consistent with acute heart failure. - BMP 11/06/22 reviewed and showed sodium 137, potassium 4.0, creatinine 0.78 & GFR >60 - LDL 74 on 08/21/22 - continue atorvastatin  40mg  daily  3: Tobacco- - smoking 1/4-1/3 ppd of cigarettes  - has previously stopped smoking for a few years  - complete cessation discussed with her  Return 1 week after echo, sooner if needed.    Gabrielle DELENA Class, FNP 10/19/23

## 2023-10-20 ENCOUNTER — Other Ambulatory Visit
Admission: RE | Admit: 2023-10-20 | Discharge: 2023-10-20 | Disposition: A | Discharge status: Home / Self Care | Source: Ambulatory Visit | Attending: Family | Admitting: Family

## 2023-10-20 ENCOUNTER — Other Ambulatory Visit (HOSPITAL_COMMUNITY): Payer: Self-pay

## 2023-10-20 ENCOUNTER — Ambulatory Visit (HOSPITAL_BASED_OUTPATIENT_CLINIC_OR_DEPARTMENT_OTHER): Admitting: Family

## 2023-10-20 ENCOUNTER — Encounter: Payer: Self-pay | Admitting: Family

## 2023-10-20 VITALS — BP 125/76 | HR 81 | Wt 150.0 lb

## 2023-10-20 DIAGNOSIS — E785 Hyperlipidemia, unspecified: Secondary | ICD-10-CM | POA: Diagnosis not present

## 2023-10-20 DIAGNOSIS — F17290 Nicotine dependence, other tobacco product, uncomplicated: Secondary | ICD-10-CM | POA: Diagnosis not present

## 2023-10-20 DIAGNOSIS — Z79899 Other long term (current) drug therapy: Secondary | ICD-10-CM | POA: Insufficient documentation

## 2023-10-20 DIAGNOSIS — I5032 Chronic diastolic (congestive) heart failure: Secondary | ICD-10-CM

## 2023-10-20 DIAGNOSIS — E079 Disorder of thyroid, unspecified: Secondary | ICD-10-CM | POA: Insufficient documentation

## 2023-10-20 DIAGNOSIS — Z720 Tobacco use: Secondary | ICD-10-CM

## 2023-10-20 DIAGNOSIS — I5181 Takotsubo syndrome: Secondary | ICD-10-CM | POA: Insufficient documentation

## 2023-10-20 DIAGNOSIS — I2510 Atherosclerotic heart disease of native coronary artery without angina pectoris: Secondary | ICD-10-CM | POA: Insufficient documentation

## 2023-10-20 DIAGNOSIS — I252 Old myocardial infarction: Secondary | ICD-10-CM | POA: Diagnosis present

## 2023-10-20 DIAGNOSIS — R5383 Other fatigue: Secondary | ICD-10-CM | POA: Diagnosis present

## 2023-10-20 LAB — LIPID PANEL
Cholesterol: 138 mg/dL (ref 0–200)
HDL: 66 mg/dL (ref >40)
LDL Cholesterol: 61 mg/dL (ref 0–99)
Total CHOL/HDL Ratio: 2.1 ratio
Triglycerides: 54 mg/dL (ref <150)
VLDL: 11 mg/dL (ref 0–40)

## 2023-10-20 LAB — BASIC METABOLIC PANEL WITH GFR
Anion gap: 9 (ref 5–15)
BUN: 30 mg/dL — ABNORMAL HIGH (ref 6–20)
CO2: 26 mmol/L (ref 22–32)
Calcium: 9 mg/dL (ref 8.9–10.3)
Chloride: 106 mmol/L (ref 98–111)
Creatinine, Ser: 0.63 mg/dL (ref 0.44–1.00)
GFR, Estimated: >60 mL/min (ref >60)
Glucose, Bld: 108 mg/dL — ABNORMAL HIGH (ref 70–99)
Potassium: 4 mmol/L (ref 3.5–5.1)
Sodium: 141 mmol/L (ref 135–145)

## 2023-10-20 MED ORDER — FUROSEMIDE 20 MG PO TABS: 20.0000 mg | ORAL_TABLET | Freq: Every day | ORAL | 3 refills | Status: AC | PRN

## 2023-10-20 MED ORDER — NITROGLYCERIN 0.4 MG SL SUBL: 0.4000 mg | SUBLINGUAL_TABLET | SUBLINGUAL | 3 refills | Status: AC | PRN

## 2023-10-20 MED ORDER — SACUBITRIL-VALSARTAN 24-26 MG PO TABS: 1.0000 | ORAL_TABLET | Freq: Two times a day (BID) | ORAL | 3 refills | Status: AC

## 2023-10-20 MED ORDER — CARVEDILOL 3.125 MG PO TABS: 3.1250 mg | ORAL_TABLET | Freq: Two times a day (BID) | ORAL | 1 refills | Status: DC

## 2023-10-20 NOTE — Patient Instructions (Addendum)
 Medication Changes:  No medication changes.   Lab Work:  Go over to the [facility]. Go pass the gift shop and have your blood work completed.  We will only call you if the results are abnormal or if the provider would like to make medication changes.   Special Instructions // Education:  Call below to ask about [employer] insurance: Ileana Lehmann, [place], CPhT Pharmacy Patient Advocate Specialist Direct Number: [phone]   It was good to see you today!   Follow-Up in: 6 months with Ellouise Class, FNP.  At the [facility], you and your health needs are our priority. We have a designated team specialized in the treatment of Heart Failure. This Care Team includes your primary Heart Failure Specialized Cardiologist (physician), Advanced Practice Providers (APPs- Physician Assistants and Nurse Practitioners), and Pharmacist who all work together to provide you with the care you need, when you need it.   You may see any of the following providers on your designated Care Team at your next follow up:  Dr. Toribio Fuel Dr. Ezra Shuck Dr. Ria Commander Dr. Odis Brownie Ellouise Class, FNP Jaun Bash, RPH-CPP  Please be sure to bring in all your medications bottles to every appointment.   Need to Contact Us :  If you have any questions or concerns before your next appointment please send us  a message through [place] or call our office at [phone].    TO LEAVE A MESSAGE FOR THE NURSE SELECT OPTION 2, PLEASE LEAVE A MESSAGE INCLUDING: YOUR NAME DATE OF BIRTH CALL BACK NUMBER REASON FOR CALL**this is important as we prioritize the call backs  YOU WILL RECEIVE A CALL BACK THE SAME DAY AS LONG AS YOU CALL BEFORE 4:00 PM

## 2023-10-21 ENCOUNTER — Other Ambulatory Visit: Payer: Self-pay | Admitting: Family

## 2023-10-21 ENCOUNTER — Ambulatory Visit: Payer: Self-pay | Admitting: Family

## 2023-10-21 NOTE — Telephone Encounter (Signed)
 Patient still currently covered under PAP program but now has active insurance so not eligible for PAP renewal. Should have access to med on insurance now. Patient has been made aware. Clinic has sent in RX to pharmacy.

## 2023-10-27 NOTE — Telephone Encounter (Signed)
Pt scheduled on 8/13

## 2023-11-19 ENCOUNTER — Other Ambulatory Visit: Payer: Self-pay | Admitting: Nurse Practitioner

## 2023-11-22 ENCOUNTER — Other Ambulatory Visit: Payer: Self-pay | Admitting: Nurse Practitioner

## 2023-12-02 ENCOUNTER — Encounter: Payer: Self-pay | Admitting: Nurse Practitioner

## 2023-12-02 ENCOUNTER — Ambulatory Visit: Attending: Nurse Practitioner | Admitting: Nurse Practitioner

## 2023-12-02 VITALS — BP 100/60 | HR 68 | Ht 61.0 in | Wt 149.2 lb

## 2023-12-02 DIAGNOSIS — I420 Dilated cardiomyopathy: Secondary | ICD-10-CM | POA: Diagnosis present

## 2023-12-02 DIAGNOSIS — I5032 Chronic diastolic (congestive) heart failure: Secondary | ICD-10-CM | POA: Diagnosis present

## 2023-12-02 DIAGNOSIS — E785 Hyperlipidemia, unspecified: Secondary | ICD-10-CM | POA: Insufficient documentation

## 2023-12-02 DIAGNOSIS — I2542 Coronary artery dissection: Secondary | ICD-10-CM | POA: Insufficient documentation

## 2023-12-02 DIAGNOSIS — I2510 Atherosclerotic heart disease of native coronary artery without angina pectoris: Secondary | ICD-10-CM | POA: Diagnosis present

## 2023-12-02 NOTE — Progress Notes (Signed)
 Office Visit    Patient Name: Gabrielle Riley Date of Encounter: 12/02/2023  Primary Care Provider:  [facility] Primary Cardiologist:  Gabrielle Hanson, MD  Cardiology APP:  Gabrielle Gabrielle Ingle, NP   Chief Complaint    58 y.o. 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  Past Medical History   Subjective   Past Medical History:  Diagnosis Date   Acute ST elevation myocardial infarction (STEMI) of inferolateral wall (HCC)    a. 11/2020 STEMI 2/2 SCAD involving OM2-->Med rx.   CAD (coronary artery disease)    a. 11/2020 STEMI/Cath: LM nl, LAD 56m, LCX nl, OM2 75/100 (SCAD), RCA 15p-->Med Rx.   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 11/2020 Echo: EF 45-50%; b. 01/2021 Echo: EF 45-50%; c. 01/2023 Echo: EF 55-60%.   FHx: rheumatic fever    Hyperlipidemia LDL goal <70    Hypothyroidism    Spontaneous dissection of coronary artery    a. 11/2020 ->OM2 75/100-->Med rx; b. 01/2021 Renal Duplex: No RAS or evidence of FMD. Nl Celiac/SMA/IMA.   Stress-induced cardiomyopathy    a. 11/2020 Echo: EF 45-50%, apical HK w/ hypercontractile LV basal segments sugg of stress-induced CM. Gr1 DD, nl RV fxn; b. 01/2021 Echo: EF 45-50%, sev mid-dist peri-apical/apical HK. Basal wall well preserved. Nl RV fxn; c. 01/2023 Echo: EF 55-60%, GrI DD, nl RV fxn, triv MR.   Tobacco abuse    Past Surgical History:  Procedure Laterality Date   CARDIAC CATHETERIZATION     ECTOPIC PREGNANCY SURGERY     LEFT HEART CATH AND CORONARY ANGIOGRAPHY N/A 12/11/2020   Procedure: LEFT HEART CATH AND CORONARY ANGIOGRAPHY;  Surgeon: Riley Lonni, MD;  Location: [facility] INVASIVE CV LAB;  Service: Cardiovascular;  Laterality: N/A;    Allergies  Allergies  Allergen  Reactions   Imitrex [Sumatriptan]     Throat closing   Sulfa  Antibiotics        History of Present Illness      58 y.o. y/o female with the above complex past medical history including spontaneous coronary artery dissection, coronary artery disease, stress-induced cardiomyopathy, HFimpEF, hyperlipidemia, hypothyroidism, and tobacco abuse. 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 This was too small for PCI and she was conservatively managed w/ single antiplatelet therapy.  Echo showed EF of 45 to 50% with apical hypokinesis and otherwise hypercontractile LV basal segment suggestive of stress-induced cardiomyopathy. She was placed on GDMT. Follow-up renal arterial duplex was negative for stenosis or evidence of fibromuscular dysplasia. Follow-up echo in October 2022 showed persistent mild LV dysfunction with an EF of 45 to 50% with severe mid to distal apical hypokinesis, while the basal wall remained well-preserved.  She was subsequently evaluated in advanced heart failure clinic and transition from losartan  to Entresto . She has since tolerated Entresto  without issues. An echocardiogram on February 02, 2023, showed an improvement in her ejection fraction to 55-60%.     Gabrielle Riley was last seen in cardiology clinic in 04/2023, at which time she reported a 30-month history of fatigue.  She was referred for pulm eval/sleep study but decided against completing.  Over the past 7 months, she notes that she has done reasonably  well.  She often feels tired in the morning but has not noticed as much fatigue during the afternoon.  She continues to work full-time.  She does not experience chest pain, dyspnea, or palpitations.  She has been having bilateral knee pain and received an injection through Ortho about 3 months ago with significant improvement.  Last night, she started noticing  some recurrence of that pain and is pending Ortho visit today.  She denies PND, orthopnea, dizziness, syncope, edema, or early satiety.  She continues to tolerate medications well. Objective   Home Medications    Current Outpatient Medications  Medication Sig Dispense Refill   aspirin  81 MG chewable tablet Chew 1 tablet (81 mg total) by mouth once daily. 90 tablet 3   atorvastatin  (LIPITOR) 40 MG tablet TAKE 1 TABLET BY MOUTH EVERY DAY 90 tablet 0   carvedilol  (COREG ) 3.125 MG tablet Take 1 tablet (3.125 mg total) by mouth 2 (two) times daily with a meal. 180 tablet 1   levothyroxine  (SYNTHROID ) 100 MCG tablet Take 100 mcg by mouth daily before breakfast.     Multiple Vitamins-Minerals (CENTRUM SILVER 50+WOMEN PO) Take 1 tablet by mouth daily.     nitroGLYCERIN  (NITROSTAT ) 0.4 MG SL tablet Place 1 tablet (0.4 mg total) under the tongue every 5 (five) minutes as needed for chest pain. 25 tablet 3   sacubitril -valsartan  (ENTRESTO ) 24-26 MG Take 1 tablet by mouth 2 (two) times daily. 180 tablet 3   furosemide  (LASIX ) 20 MG tablet Take 1 tablet (20 mg total) by mouth daily as needed. 30 tablet 3   lidocaine  (XYLOCAINE ) 5 % ointment Apply 1 Application topically as needed. 35.44 g 0   No current facility-administered medications for this visit.     Physical Exam    VS:  BP 100/60 (BP Location: Left Arm, Patient Position: Sitting)   Pulse 68   Ht 5' 1 (1.549 m)   Wt 149 lb 3.2 oz (67.7 kg)   SpO2 98%   BMI 28.19 kg/m  , BMI Body mass index is 28.19 kg/m.    STOP-Bang Score:  3        GEN: Well nourished, well developed, in no acute distress. HEENT: normal. Neck: Supple, no JVD, carotid bruits, or masses. Cardiac: RRR, no murmurs, rubs, or gallops. No clubbing, cyanosis, edema.  Radials 2+/PT 2+ and equal bilaterally.  Respiratory:  Respirations regular and unlabored, clear to auscultation bilaterally. GI: Soft, nontender, nondistended, BS + x 4. MS: no deformity or atrophy. Skin:  warm and dry, no rash. Neuro:  Strength and sensation are intact. Psych: Normal affect.  Accessory Clinical Findings    ECG personally reviewed by me today - EKG Interpretation Date/Time:  Wednesday December 02 2023 10:49:03 EDT Ventricular Rate:  68 PR Interval:  162 QRS Duration:  80 QT Interval:  408 QTC Calculation: 433 R Axis:   59  Text Interpretation: Normal sinus rhythm T wave abnormality, consider inferior ischemia T wave abnormality, consider anterolateral ischemia When compared with ECG of 07-May-2023 13:50, No significant change was found Confirmed by Gabrielle Bruckner [phone]) on 12/02/2023 11:05:26 AM  - no acute changes.  Labs dated December 01, 2023 from Care Everywhere:  Hemoglobin 12.8, hematocrit 39.3, WBC 7.0, platelets 256 Sodium 137, potassium 4.0, chloride 101, CO2 28, BUN 26, creatinine 0.7, glucose 88 Calcium  9.4, albumin 3.8, total protein 6.4 Total bilirubin 0.6, alkaline phosphatase 82, AST 22, ALT 18 TSH 1.59 Total cholesterol 148, triglycerides 77, HDL 68, LDL 65  Assessment & Plan    1.  Coronary artery disease/spontaneous coronary artery dissection: Admitted August 2022 with chest pain and inferolateral ST segment elevation with finding of spontaneous coronary artery dissection of the second obtuse marginal.  Renal arterial duplex was negative for stenosis or evidence of fibromuscular dysplasia.  She has been medically managed with GDMT and has been doing well without chest pain or dyspnea.  She remains on aspirin , statin, and beta-blocker.  2.  Ischemic cardiomyopathy/chronic heart failure with improved ejection fraction: EF 45 to 50% by echo in October 2022 with subsequent improvement to 55-60% by echo in October 2024.  She is euvolemic on examination and has been doing well without symptoms or limitations.  She remains on beta-blocker and Entresto .  She is no longer requiring diuretic therapy.  3.  Hyperlipidemia: Labs drawn yesterday with an LDL of 65  and normal LFTs.  She remains on atorvastatin  therapy.  4.  Tobacco abuse: Previously quit smoking cigarettes but uses nicotine vapes.  Cessation advised.  5.  History of fatigue.  Seems to have improved since her last visit, though still has some fatigue first thing in the morning after awakening.  Previously offered pulmonology evaluation for sleep study (STOP-BANG equals 3) however, she does not wish to proceed.  6.  Disposition: Follow-up in 6 months or sooner if necessary.  Gabrielle Meager, NP 12/02/2023, 11:42 AM

## 2023-12-02 NOTE — Patient Instructions (Signed)
 Medication Instructions:  The current medical regimen is effective;  continue present plan and medications as directed. Please refer to the Current Medication list given to you today.   *If you need a refill on your cardiac medications before your next appointment, please call your pharmacy*  Follow-Up: At [facility], you and your health needs are our priority.  As part of our continuing mission to provide you with exceptional heart care, our providers are all part of one team.  This team includes your primary Cardiologist (physician) and Advanced Practice Providers or APPs (Physician Assistants and Nurse Practitioners) who all work together to provide you with the care you need, when you need it.  Your next appointment:   6 month(s)  Provider:   Lonni Hanson, MD or Lonni Meager, NP    We recommend signing up for the patient portal called MyChart.  Sign up information is provided on this After Visit Summary.  MyChart is used to connect with patients for Virtual Visits (Telemedicine).  Patients are able to view lab/test results, encounter notes, upcoming appointments, etc.  Non-urgent messages can be sent to your provider as well.   To learn more about what you can do with MyChart, go to [email or web address].

## 2023-12-17 LAB — COLOGUARD: COLOGUARD: NEGATIVE

## 2024-02-19 ENCOUNTER — Other Ambulatory Visit: Payer: Self-pay | Admitting: Nurse Practitioner

## 2024-04-25 ENCOUNTER — Telehealth: Payer: Self-pay | Admitting: Family

## 2024-04-25 NOTE — Progress Notes (Signed)
 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   Echo 12/12/20: EF of 45-50% along with apical hypokinesis suggesting Takotsubo  LHC done 12/11/20 and showed  Inferolateral STEMI due to spontaneous coronary artery dissection of small-moderate caliber branch arising from OM2.  Vessel is too small/distal for intervention. Mild, nonobstructive coronary artery disease involving the LAD and nondominant RCA. Moderately reduced left ventricular systolic function with apical akinesis and otherwise preserved left ventricular ejection fraction.  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 Moderately-severely elevated left ventricular filling pressure consistent with acute heart failure.  Echo 02/01/21: EF of 45-50%.   Admitted 11/25/22 with rib pain and swelling of the lower extremities. Recent ED visit found to have rib fractures. Chest x-ray 2 view: Stated to refer to CT scan for further details. Patchy atelectasis changes in bilateral lung base. GIven morphine , robaxin , azithromycin . Admitted to hospitalist service for pain control. Of note: admitting hospitalist did not note any swelling of her lower extremities or pitting edema, pt reported that it had improved significantly since earlier that morning.   Echo 02/02/23: EF 55-60%, normal RV  She presents today with a chief complaint of a heart failure visit. Currently denies any shortness of breath, fatigue, chest pain, palpitations, dizziness, edema or difficulty sleeping. Continues to work at a very stressful job but is actively looking for a different job.  Continues to vape but less than before and her goal is to stop vaping completely.   She is doing research about getting dental implants in the country of [place]. She says that she can get her implants done there markedly cheaper than in the [place]  and this place has great reviews and has done thousands of implants. Will not be for several months as she still has to get her passport.   ROS: All systems negative except what is listed in HPI, PMH and Problem List  Past Medical History:  Diagnosis Date   Acute ST elevation myocardial infarction (STEMI) of inferolateral wall (HCC)    a. 11/2020 STEMI 2/2 SCAD involving OM2-->Med rx.   CAD (coronary artery disease)    a. 11/2020 STEMI/Cath: LM nl, LAD 55m, LCX nl, OM2 75/100 (SCAD), RCA 15p-->Med Rx.   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 11/2020 Echo: EF 45-50%; b. 01/2021 Echo: EF 45-50%; c. 01/2023 Echo: EF 55-60%.   FHx: rheumatic fever    Hyperlipidemia LDL goal <70    Hypothyroidism    Spontaneous dissection of coronary artery    a. 11/2020 ->OM2 75/100-->Med rx; b. 01/2021 Renal Duplex: No RAS or evidence of FMD. Nl Celiac/SMA/IMA.   Stress-induced cardiomyopathy    a. 11/2020 Echo: EF 45-50%, apical HK w/ hypercontractile LV basal segments sugg of stress-induced CM. Gr1 DD, nl RV fxn; b. 01/2021 Echo: EF 45-50%, sev mid-dist peri-apical/apical HK. Basal wall well preserved. Nl RV fxn; c. 01/2023 Echo: EF 55-60%, GrI DD, nl RV fxn, triv MR.   Tobacco abuse     Current Outpatient Medications  Medication Sig Dispense Refill   aspirin  81 MG chewable tablet Chew 1 tablet (81 mg total) by mouth once daily.  90 tablet 3   atorvastatin  (LIPITOR) 40 MG tablet TAKE 1 TABLET BY MOUTH EVERY DAY 90 tablet 3   carvedilol  (COREG ) 3.125 MG tablet Take 1 tablet (3.125 mg total) by mouth 2 (two) times daily with a meal. 180 tablet 1   furosemide  (LASIX ) 20 MG tablet Take 1 tablet (20 mg total) by mouth daily as needed. 30  tablet 3   levothyroxine  (SYNTHROID ) 100 MCG tablet Take 100 mcg by mouth daily before breakfast.     lidocaine  (XYLOCAINE ) 5 % ointment Apply 1 Application topically as needed. 35.44 g 0   Multiple Vitamins-Minerals (CENTRUM SILVER 50+WOMEN PO) Take 1 tablet by mouth daily.     nitroGLYCERIN  (NITROSTAT ) 0.4 MG SL tablet Place 1 tablet (0.4 mg total) under the tongue every 5 (five) minutes as needed for chest pain. 25 tablet 3   sacubitril -valsartan  (ENTRESTO ) 24-26 MG Take 1 tablet by mouth 2 (two) times daily. 180 tablet 3   No current facility-administered medications for this visit.    Allergies  Allergen Reactions   Imitrex [Sumatriptan]     Throat closing   Sulfa  Antibiotics       Social History   Socioeconomic History   Marital status: Significant Other    Spouse name: Not on file   Number of children: Not on file   Years of education: Not on file   Highest education level: Not on file  Occupational History   Not on file  Tobacco Use   Smoking status: Every Day    Current packs/day: 0.50    Average packs/day: 0.5 packs/day for 25.0 years (12.5 ttl pk-yrs)    Types: Cigarettes   Smokeless tobacco: Never   Tobacco comments:    Ready to Quit, has not set a quit date yet.   Vaping Use   Vaping status: Never Used  Substance and Sexual Activity   Alcohol use: Not Currently    Comment: 1-2 drinks occassionally   Drug use: Not Currently   Sexual activity: Not Currently    Birth control/protection: Post-menopausal  Other Topics Concern   Not on file  Social History Narrative   Not on file   Social Drivers of Health   Tobacco Use: High Risk (12/02/2023)   Patient History    Smoking Tobacco Use: Every Day    Smokeless Tobacco Use: Never    Passive Exposure: Not on file  Financial Resource Strain: Medium Risk (12/01/2023)   Received from [facility]   Overall Financial Resource Strain (CARDIA)    Difficulty of Paying Living Expenses: Somewhat hard   Food Insecurity: Food Insecurity Present (12/01/2023)   Received from [facility]   Epic    Within the past 12 months, you worried that your food would run out before you got the money to buy more.: Sometimes true    Within the past 12 months, the food you bought just didn't last and you didn't have money to get more.: Sometimes true  Transportation Needs: Unknown (12/01/2023)   Received from [facility] - Transportation    In the past 12 months, has lack of transportation kept you from medical appointments or from getting medications?: Patient declined    Lack of Transportation (Non-Medical): Not on file  Physical Activity: Not on file  Stress: Not on file  Social Connections: Not on file  Intimate Partner Violence: Not At Risk (11/25/2022)   Humiliation, Afraid, Rape, and Kick questionnaire    Fear  of Current or Ex-Partner: No    Emotionally Abused: No    Physically Abused: No    Sexually Abused: No  Depression (PHQ2-9): Not on file  Alcohol Screen: Not on file  Housing: High Risk (12/01/2023)   Received from [facility]   Epic    In the last 12 months, was there a time when you were not able to pay the mortgage or rent on time?: Yes    Number of Times Moved in the Last Year: Not on file    At any time in the past 12 months, were you homeless or living in a shelter (including now)?: No  Utilities: At Risk (12/01/2023)   Received from [facility]   Epic    In the past 12 months has the electric, gas, oil, or water company threatened to shut off services in your home?: Yes  Health Literacy: Not on file      Family History  Problem Relation Age of Onset   Diabetes Sister    Heart attack Brother    Vitals:   04/26/24 1538  BP: 111/71  Pulse: 82  SpO2: 98%  Weight: 146 lb 3.2 oz (66.3 kg)   Wt Readings from Last 3 Encounters:  04/26/24 146 lb 3.2 oz (66.3 kg)  12/02/23 149 lb 3.2 oz (67.7 kg)   10/20/23 150 lb (68 kg)   Lab Results  Component Value Date   CREATININE 0.63 10/20/2023   CREATININE 0.84 05/07/2023   CREATININE 0.77 11/26/2022   PHYSICAL EXAM:  General: Well appearing.  Cor: No JVD. Regular rhythm, rate.  Lungs: clear Abdomen: soft, nontender, nondistended. Extremities: no edema Neuro:. Affect pleasant    ECG: not done   ASSESSMENT & PLAN:  1: Chronic ischemic heart failure with preserved ejection fraction (Takotsubo)- - thought to be stress induced - NYHA Riley I - euvolemic today - weight up 5 pounds from last visit here 1 year ago - Echo 12/12/20: EF of 45-50% along with apical hypokinesis suggesting Takotsubo - Echo 02/01/21: EF of 45-50%. - Echo 02/02/23: EF 55-60%, normal RV - will get echo updated since she's considering going out of the [place] dental implants - not adding salt to her food and has been reading food labels - continue carvedilol  3.125mg  BID  - continue furosemide  PRN; hasn't needed to take this in awhile - continue entresto  24/26mg  BID   2: History of STEMI due to spontaneous coronary artery dissection- - saw cardiology Alexander) 08/25 - LHC done 12/11/20 and showed  Inferolateral STEMI due to spontaneous coronary artery dissection of small-moderate caliber branch arising from OM2.  Vessel is too small/distal for intervention. Mild, nonobstructive coronary artery disease involving the LAD and nondominant RCA. Moderately reduced left ventricular systolic function with apical akinesis and otherwise preserved left ventricular ejection fraction.  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 Moderately-severely elevated left ventricular filling pressure consistent with acute heart failure. - BMP 12/01/23 reviewed: sodium 137, potassium 4.0, creatinine 0.7 & GFR 100 - BMET today  3: Tobacco- - no longer smoking cigarettes but is vaping although less than before - difficult to  stop as numerous people at work smoke around her - complete cessation discussed with her  4: Hyperlipidemia- - continue atorvastatin  40mg  daily  - LDL 61 on 10/20/23 - lipid panel today   Return in 6 months, sooner if needed.   I spent 30 minutes reviewing records, interviewing/ examing patient and managing plan/  orders.   Gabrielle DELENA Class, FNP 04/25/2024  "

## 2024-04-25 NOTE — Telephone Encounter (Signed)
 Called to confirm/remind patient of their appointment at the Advanced [facility] Clinic on 04/26/24.   Appointment:   [x] Confirmed  [] Left mess   [] No answer/No voice mail  [] VM Full/unable to leave message  [] Phone not in service  Patient reminded to bring all medications and/or complete list.  Confirmed patient has transportation. Gave directions, instructed to utilize valet parking.

## 2024-04-26 ENCOUNTER — Ambulatory Visit: Admitting: Family

## 2024-04-26 ENCOUNTER — Ambulatory Visit: Payer: Self-pay | Admitting: Family

## 2024-04-26 ENCOUNTER — Encounter: Payer: Self-pay | Admitting: Family

## 2024-04-26 ENCOUNTER — Other Ambulatory Visit
Admission: RE | Admit: 2024-04-26 | Discharge: 2024-04-26 | Disposition: A | Discharge status: Home / Self Care | Source: Ambulatory Visit | Attending: Family | Admitting: Family

## 2024-04-26 VITALS — BP 111/71 | HR 82 | Wt 146.2 lb

## 2024-04-26 DIAGNOSIS — I5022 Chronic systolic (congestive) heart failure: Secondary | ICD-10-CM | POA: Insufficient documentation

## 2024-04-26 DIAGNOSIS — Z720 Tobacco use: Secondary | ICD-10-CM

## 2024-04-26 DIAGNOSIS — E785 Hyperlipidemia, unspecified: Secondary | ICD-10-CM | POA: Diagnosis not present

## 2024-04-26 DIAGNOSIS — I5032 Chronic diastolic (congestive) heart failure: Secondary | ICD-10-CM

## 2024-04-26 DIAGNOSIS — I2542 Coronary artery dissection: Secondary | ICD-10-CM

## 2024-04-26 LAB — BASIC METABOLIC PANEL WITH GFR
Anion gap: 9 (ref 5–15)
BUN: 19 mg/dL (ref 6–20)
CO2: 29 mmol/L (ref 22–32)
Calcium: 9.8 mg/dL (ref 8.9–10.3)
Chloride: 102 mmol/L (ref 98–111)
Creatinine, Ser: 0.72 mg/dL (ref 0.44–1.00)
GFR, Estimated: >60 mL/min
Glucose, Bld: 85 mg/dL (ref 70–99)
Potassium: 3.8 mmol/L (ref 3.5–5.1)
Sodium: 139 mmol/L (ref 135–145)

## 2024-04-26 LAB — LIPID PANEL
Cholesterol: 140 mg/dL (ref 0–200)
HDL: 65 mg/dL
LDL Cholesterol: 67 mg/dL (ref 0–99)
Total CHOL/HDL Ratio: 2.1 ratio
Triglycerides: 41 mg/dL
VLDL: 8 mg/dL (ref 0–40)

## 2024-04-26 NOTE — Patient Instructions (Signed)
 Medication Changes:  No medication changes today!  Lab Work:  Go over to the [facility]. Go pass the gift shop and have your blood work completed.  We will only call you if the results are abnormal or if the provider would like to make medication changes.  No news is good news.     Testing/Procedures:  Your physician has requested that you have an echocardiogram. Echocardiography is a painless test that uses sound waves to create images of your heart. It provides your doctor with information about the size and shape of your heart and how well your hearts chambers and valves are working. This procedure takes approximately one hour. There are no restrictions for this procedure. Please do NOT wear cologne, perfume, aftershave, or lotions (deodorant is allowed). Please arrive 15 minutes prior to your appointment time.  Please note: We ask at that you not bring children with you during ultrasound (echo/ vascular) testing. Due to room size and safety concerns, children are not allowed in the ultrasound rooms during exams. Our front office staff cannot provide observation of children in our lobby area while testing is being conducted. An adult accompanying a patient to their appointment will only be allowed in the ultrasound room at the discretion of the ultrasound technician under special circumstances. We apologize for any inconvenience.  Someone will be in contact with you in order to scheduled an appointment.   Follow-Up in: Please follow up with the Advanced Heart Failure Clinic in 6 months with Ellouise Class, FNP.   Thank you for choosing [facility].    At the Advanced Heart Failure Clinic, you and your health needs are our priority. We have a designated team specialized in the treatment of Heart Failure. This Care Team includes your primary Heart Failure Specialized Cardiologist (physician), Advanced Practice Providers (APPs- Physician Assistants  and Nurse Practitioners), and Pharmacist who all work together to provide you with the care you need, when you need it.   You may see any of the following providers on your designated Care Team at your next follow up:  Dr. Toribio Fuel Dr. Ezra Shuck Dr. Ria Commander Dr. Morene Brownie Ellouise Class, FNP Jaun Bash, RPH-CPP  Please be sure to bring in all your medications bottles to every appointment.   Need to Contact Us :  If you have any questions or concerns before your next appointment please send us  a message through [place] or call our office at [phone].    TO LEAVE A MESSAGE FOR THE NURSE SELECT OPTION 2, PLEASE LEAVE A MESSAGE INCLUDING: YOUR NAME DATE OF BIRTH CALL BACK NUMBER REASON FOR CALL**this is important as we prioritize the call backs  YOU WILL RECEIVE A CALL BACK THE SAME DAY AS LONG AS YOU CALL BEFORE 4:00 PM;v

## 2024-05-26 ENCOUNTER — Other Ambulatory Visit: Payer: Self-pay | Admitting: Family

## 2024-10-25 ENCOUNTER — Ambulatory Visit: Admitting: Family
